# Patient Record
Sex: Female | Born: 1945 | Race: White | Hispanic: No
Health system: Southern US, Community
[De-identification: ages and names within clinical notes are randomized; demographics above are authoritative.]

## PROBLEM LIST (undated history)

## (undated) DIAGNOSIS — F0280 Dementia in other diseases classified elsewhere without behavioral disturbance: Secondary | ICD-10-CM

## (undated) DIAGNOSIS — G309 Alzheimer's disease, unspecified: Secondary | ICD-10-CM

---

## 2018-07-26 ENCOUNTER — Emergency Department
Admission: EM | Admit: 2018-07-26 | Discharge: 2018-07-26 | Disposition: A | Discharge status: Skilled Nursing Facility | Payer: Medicare Other | Attending: Emergency Medicine | Admitting: Emergency Medicine

## 2018-07-26 ENCOUNTER — Emergency Department: Payer: Medicare Other

## 2018-07-26 ENCOUNTER — Other Ambulatory Visit: Payer: Self-pay

## 2018-07-26 DIAGNOSIS — S0990XA Unspecified injury of head, initial encounter: Secondary | ICD-10-CM | POA: Diagnosis present

## 2018-07-26 DIAGNOSIS — S0083XA Contusion of other part of head, initial encounter: Secondary | ICD-10-CM | POA: Insufficient documentation

## 2018-07-26 DIAGNOSIS — Y92199 Unspecified place in other specified residential institution as the place of occurrence of the external cause: Secondary | ICD-10-CM | POA: Insufficient documentation

## 2018-07-26 DIAGNOSIS — Y939 Activity, unspecified: Secondary | ICD-10-CM | POA: Diagnosis not present

## 2018-07-26 DIAGNOSIS — Y999 Unspecified external cause status: Secondary | ICD-10-CM | POA: Diagnosis not present

## 2018-07-26 DIAGNOSIS — G309 Alzheimer's disease, unspecified: Secondary | ICD-10-CM | POA: Diagnosis not present

## 2018-07-26 DIAGNOSIS — W06XXXA Fall from bed, initial encounter: Secondary | ICD-10-CM | POA: Diagnosis not present

## 2018-07-26 DIAGNOSIS — W19XXXA Unspecified fall, initial encounter: Secondary | ICD-10-CM

## 2018-07-26 HISTORY — DX: Dementia in other diseases classified elsewhere, unspecified severity, without behavioral disturbance, psychotic disturbance, mood disturbance, and anxiety: F02.80

## 2018-07-26 HISTORY — DX: Alzheimer's disease, unspecified: G30.9

## 2018-07-26 NOTE — ED Notes (Signed)
Patient transported to CT 

## 2018-07-26 NOTE — ED Triage Notes (Signed)
Pt arrives from [facility] via [facility]. Pt fell out of bed per EMS and got up back into bed. EMS reports pt is from alzheimers unit. Pt is alert and oriented. Bruising noted to R forehead and R cheek. Dr. Mayford Knife in room upon arrival.

## 2018-07-26 NOTE — ED Provider Notes (Signed)
[facility] Emergency Department Provider Note       Time seen: ----------------------------------------- 8:39 PM on 07/26/2018 -----------------------------------------   I have reviewed the triage vital signs and the nursing notes.  HISTORY   Chief Complaint Fall Level V caveat: History/ROS limited by dementia   HPI Brooke Hood is a 72 y.o. female with a history of Alzheimer's disease who presents to the ED for a witnessed fall.  Patient arrives by EMS from [place] [facility] after she fell out of bed.  According to EMS she got back up into the bed.  She is from the Alzheimer's unit and is disoriented.  Bruising was noted and around the right eye cheek and forehead.  She denies any other injuries or complaints.  Past Medical History:  Diagnosis Date  . Alzheimer disease     There are no active problems to display for this patient.   History reviewed. No pertinent surgical history.  Allergies Patient has no known allergies.  Social History Social History   Tobacco Use  . Smoking status: Unknown If Ever Smoked  Substance Use Topics  . Alcohol use: Not Currently  . Drug use: Not on file   Review of Systems Constitutional: Negative for fever. Eyes: Negative for vision changes ENT:  Negative for congestion, sore throat Cardiovascular: Negative for chest pain. Respiratory: Negative for shortness of breath. Gastrointestinal: Negative for abdominal pain, vomiting and diarrhea. Genitourinary: Negative for dysuria. Musculoskeletal: Negative for back pain. Skin: Negative for rash. Neurological: Negative for headaches, focal weakness or numbness.  All systems negative/normal/unremarkable except as stated in the HPI  ____________________________________________   PHYSICAL EXAM:  VITAL SIGNS: ED Triage Vitals  Enc Vitals Group     BP 07/26/18 2024 (!) 142/64     Pulse Rate 07/26/18 2024 (!) 58     Resp 07/26/18 2024 16     Temp 07/26/18 2024  98.4 F (36.9 C)     Temp Source 07/26/18 2024 Oral     SpO2 07/26/18 2024 100 %     Weight 07/26/18 2025 135 lb (61.2 kg)     Height 07/26/18 2025 5\' 7"  (1.702 m)     Head Circumference --      Peak Flow --      Pain Score 07/26/18 2025 0     Pain Loc --      Pain Edu? --      Excl. in GC? --    Constitutional: Alert but disoriented.  Well appearing and in no distress. Eyes: Conjunctivae are normal. Normal extraocular movements. ENT   Head: Normocephalic, large right periorbital and right forehead contusion   Nose: No congestion/rhinnorhea.   Mouth/Throat: Mucous membranes are moist.   Neck: No stridor. Cardiovascular: Normal rate, regular rhythm. No murmurs, rubs, or gallops. Respiratory: Normal respiratory effort without tachypnea nor retractions. Breath sounds are clear and equal bilaterally. No wheezes/rales/rhonchi. Gastrointestinal: Soft and nontender. Normal bowel sounds Musculoskeletal: Nontender with normal range of motion in extremities. No lower extremity tenderness nor edema.  No pain elicited with range of motion of the hips, upper or lower extremities Neurologic:  Normal speech and language. No gross focal neurologic deficits are appreciated.  Skin: Right periorbital ecchymosis ____________________________________________  ED COURSE:  As part of my medical decision making, I reviewed the following data within the electronic MEDICAL RECORD NUMBER History obtained from family if available, nursing notes, old chart and ekg, as well as notes from prior ED visits. Patient presented for a fall, we will  assess with imaging as indicated at this time.   Procedures ____________________________________________   RADIOLOGY Images were viewed by me  CT head, maxillofacial IMPRESSION: 1. No evidence of acute intracranial abnormality. 2. Moderate cerebral atrophy and mild to moderate chronic small vessel ischemic disease. 3. Right periorbital and forehead  hematoma. 4. No acute maxillofacial fracture. ____________________________________________  DIFFERENTIAL DIAGNOSIS   Fall, contusion, fracture, subdural hematoma, concussion  FINAL ASSESSMENT AND PLAN  Fall, minor head injury, facial contusion   Plan: The patient had presented for a fall. Patient's imaging did not reveal any acute process, she is cleared for outpatient follow-up.   Ulice Dash, MD   Note: This note was generated in part or whole with voice recognition software. Voice recognition is usually quite accurate but there are transcription errors that can and very often do occur. I apologize for any typographical errors that were not detected and corrected.     Emily Filbert, MD 07/26/18 2155

## 2018-07-26 NOTE — ED Notes (Signed)
Pt unable to sign for DC d/t alzheimers. Pt taken back to [facility] by [facility].

## 2018-09-12 ENCOUNTER — Emergency Department: Payer: Medicare Other

## 2018-09-12 ENCOUNTER — Emergency Department
Admission: EM | Admit: 2018-09-12 | Discharge: 2018-09-12 | Disposition: A | Discharge status: Other Acute Inpatient Hospital | Payer: Medicare Other | Attending: Emergency Medicine | Admitting: Emergency Medicine

## 2018-09-12 ENCOUNTER — Encounter: Payer: Self-pay | Admitting: Emergency Medicine

## 2018-09-12 ENCOUNTER — Other Ambulatory Visit: Payer: Self-pay

## 2018-09-12 ENCOUNTER — Ambulatory Visit (HOSPITAL_COMMUNITY)
Admission: AD | Admit: 2018-09-12 | Discharge: 2018-09-12 | Disposition: A | Discharge status: Home / Self Care | Payer: Medicare Other | Source: Other Acute Inpatient Hospital | Attending: Emergency Medicine | Admitting: Emergency Medicine

## 2018-09-12 DIAGNOSIS — W19XXXA Unspecified fall, initial encounter: Secondary | ICD-10-CM | POA: Diagnosis not present

## 2018-09-12 DIAGNOSIS — Y998 Other external cause status: Secondary | ICD-10-CM | POA: Insufficient documentation

## 2018-09-12 DIAGNOSIS — Y92129 Unspecified place in nursing home as the place of occurrence of the external cause: Secondary | ICD-10-CM | POA: Insufficient documentation

## 2018-09-12 DIAGNOSIS — W0110XA Fall on same level from slipping, tripping and stumbling with subsequent striking against unspecified object, initial encounter: Secondary | ICD-10-CM | POA: Diagnosis not present

## 2018-09-12 DIAGNOSIS — F0280 Dementia in other diseases classified elsewhere without behavioral disturbance: Secondary | ICD-10-CM | POA: Insufficient documentation

## 2018-09-12 DIAGNOSIS — Y9389 Activity, other specified: Secondary | ICD-10-CM | POA: Insufficient documentation

## 2018-09-12 DIAGNOSIS — G309 Alzheimer's disease, unspecified: Secondary | ICD-10-CM | POA: Diagnosis not present

## 2018-09-12 DIAGNOSIS — S066X0A Traumatic subarachnoid hemorrhage without loss of consciousness, initial encounter: Secondary | ICD-10-CM | POA: Diagnosis present

## 2018-09-12 DIAGNOSIS — S0101XA Laceration without foreign body of scalp, initial encounter: Secondary | ICD-10-CM | POA: Diagnosis not present

## 2018-09-12 DIAGNOSIS — S0990XA Unspecified injury of head, initial encounter: Secondary | ICD-10-CM | POA: Diagnosis present

## 2018-09-12 DIAGNOSIS — I609 Nontraumatic subarachnoid hemorrhage, unspecified: Secondary | ICD-10-CM

## 2018-09-12 LAB — CBC WITH DIFFERENTIAL/PLATELET
ABS IMMATURE GRANULOCYTES: 0.02 10*3/uL (ref 0.00–0.07)
BASOS PCT: 0 %
Basophils Absolute: 0 10*3/uL (ref 0.0–0.1)
EOS ABS: 0 10*3/uL (ref 0.0–0.5)
Eosinophils Relative: 1 %
HCT: 37.9 % (ref 36.0–46.0)
Hemoglobin: 12.6 g/dL (ref 12.0–15.0)
IMMATURE GRANULOCYTES: 0 %
Lymphocytes Relative: 22 %
Lymphs Abs: 1.3 10*3/uL (ref 0.7–4.0)
MCH: 29.6 pg (ref 26.0–34.0)
MCHC: 33.2 g/dL (ref 30.0–36.0)
MCV: 89.2 fL (ref 80.0–100.0)
MONO ABS: 0.4 10*3/uL (ref 0.1–1.0)
Monocytes Relative: 8 %
NEUTROS ABS: 4 10*3/uL (ref 1.7–7.7)
NRBC: 0 % (ref 0.0–0.2)
Neutrophils Relative %: 69 %
PLATELETS: 186 10*3/uL (ref 150–400)
RBC: 4.25 MIL/uL (ref 3.87–5.11)
RDW: 13.6 % (ref 11.5–15.5)
WBC: 5.8 10*3/uL (ref 4.0–10.5)

## 2018-09-12 LAB — BASIC METABOLIC PANEL
ANION GAP: 9 (ref 5–15)
BUN: 10 mg/dL (ref 8–23)
CHLORIDE: 101 mmol/L (ref 98–111)
CO2: 29 mmol/L (ref 22–32)
Calcium: 8.4 mg/dL — ABNORMAL LOW (ref 8.9–10.3)
Creatinine, Ser: 0.61 mg/dL (ref 0.44–1.00)
GFR calc Af Amer: >60 mL/min (ref >60)
Glucose, Bld: 98 mg/dL (ref 70–99)
POTASSIUM: 3.8 mmol/L (ref 3.5–5.1)
SODIUM: 139 mmol/L (ref 135–145)

## 2018-09-12 LAB — PROTIME-INR
INR: 1.05
PROTHROMBIN TIME: 13.6 s (ref 11.4–15.2)

## 2018-09-12 MED ORDER — LIDOCAINE-EPINEPHRINE 2 %-1:100000 IJ SOLN
20.0000 mL | Freq: Once | INTRAMUSCULAR | Status: AC
  Administered 2018-09-12: 1 mL via INTRADERMAL

## 2018-09-12 MED ORDER — LIDOCAINE-EPINEPHRINE 2 %-1:100000 IJ SOLN
INTRAMUSCULAR | Status: AC
  Administered 2018-09-12: 1 mL via INTRADERMAL
  Filled 2018-09-12: qty 1

## 2018-09-12 MED ORDER — LIDOCAINE-EPINEPHRINE (PF) 2 %-1:200000 IJ SOLN: 10.0000 mL | Freq: Once | INTRAMUSCULAR | Status: DC

## 2018-09-12 MED ORDER — TETANUS-DIPHTH-ACELL PERTUSSIS 5-2.5-18.5 LF-MCG/0.5 IM SUSP: 0.5000 mL | Freq: Once | INTRAMUSCULAR | Status: DC

## 2018-09-12 NOTE — ED Notes (Signed)
Pt relative notified  [place], [place] Relative   [phone]

## 2018-09-12 NOTE — ED Notes (Signed)
Report to [facility], [facility] att, approx out

## 2018-09-12 NOTE — ED Notes (Signed)
[facility] to transport to [facility] per Melody

## 2018-09-12 NOTE — ED Notes (Signed)
Patient transported to CT 

## 2018-09-12 NOTE — ED Triage Notes (Signed)
Pt presents to ED via [facility] from [facility] c/o witnessed fall. EMS report pt tripped while using her walker. Lac sustained to L forehead and skin tear to L hand. EMS report SNF staff state pt is at baseline mental status at this time - nonverbal, hx dementia. Pt does not answer questions or follow commands. Is periodically combative with care. No blood thinners noted on MAR.   EMS report swelling to L knee. Pt does not have any s/sx with motion of bil lower extremities. Multiple old bruises noted to both knees and shins.

## 2018-09-12 NOTE — ED Provider Notes (Addendum)
[facility] Emergency Department Provider Note  ____________________________________________   I have reviewed the triage vital signs and the nursing notes. Where available I have reviewed prior notes and, if possible and indicated, outside hospital notes.    HISTORY  Chief Complaint Fall and Head Injury    HPI Brooke Hood is a 72 y.o. female  Who suffers from significant dementia, is at her baseline according to EMS, tripped over her walker.  Patient herself cannot give a steroid.  Did sustain a head injury.  No loss of consciousness known.  Level 5 chart caveat; no further history available due to patient status.  I did try to call her emergency contact with a phone rang and no unanswered there was no answering machine that picked up.  Patient's med list does not show any anticoagulation patient is DNR/DNI    Past Medical History:  Diagnosis Date  . Alzheimer disease (HCC)     There are no active problems to display for this patient.   History reviewed. No pertinent surgical history.  Prior to Admission medications   Not on File    Allergies Patient has no known allergies.  History reviewed. No pertinent family history.  Social History Social History   Tobacco Use  . Smoking status: Unknown If Ever Smoked  . Smokeless tobacco: Never Used  Substance Use Topics  . Alcohol use: Not Currently  . Drug use: Not on file    Review of Systems Level 5 chart caveat; no further history available due to patient status.  ____________________________________________   PHYSICAL EXAM:  VITAL SIGNS: ED Triage Vitals  Enc Vitals Group     BP 09/12/18 1914 (!) 119/48     Pulse Rate 09/12/18 1914 70     Resp 09/12/18 1914 18     Temp 09/12/18 1914 98.2 F (36.8 C)     Temp Source 09/12/18 1914 Axillary     SpO2 09/12/18 1914 100 %     Weight 09/12/18 1915 140 lb (63.5 kg)     Height 09/12/18 1915 5\' 6"  (1.676 m)     Head Circumference --       Peak Flow --      Pain Score --      Pain Loc --      Pain Edu? --      Excl. in GC? --     Constitutional: Alert will tell me her name, no acute distress, significant dementia noted. Eyes: Conjunctivae are normal Head: Stellate laceration to the left forehead.  Bleeding controlled.  No evidence of acute bony injury HEENT: No congestion/rhinnorhea. Mucous membranes are moist.  Oropharynx non-erythematous Neck:   Nontender with no meningismus, no masses, no stridor Cardiovascular: Normal rate, regular rhythm. Grossly normal heart sounds.  Good peripheral circulation. Respiratory: Normal respiratory effort.  No retractions. Lungs CTAB. Abdominal: Soft and nontender. No distention. No guarding no rebound Back:  There is no focal tenderness or step off.  there is no midline tenderness there are no lesions noted. there is no CVA tenderness Musculoskeletal: No lower extremity tenderness, no upper extremity tenderness. No joint effusions, no DVT signs strong distal pulses no edema, I can range her joints with no evidence of discomfort. Neurologic: We will talk to me a little bit sometimes tells me her name Skin:  Skin is warm, stellate laceration to l forehead lineal laceration to l forehead and skin tear to l hand Psychiatric: Mood and affect are normal. Speech and behavior are normal.  ____________________________________________   LABS (all labs ordered are listed, but only abnormal results are displayed)  Labs Reviewed  BASIC METABOLIC PANEL  CBC WITH DIFFERENTIAL/PLATELET  PROTIME-INR    Pertinent labs  results that were available during my care of the patient were reviewed by me and considered in my medical decision making (see chart for details). ____________________________________________  EKG  I personally interpreted any EKGs ordered by me or triage  ____________________________________________  RADIOLOGY  Pertinent labs & imaging results that were available during my  care of the patient were reviewed by me and considered in my medical decision making (see chart for details). If possible, patient and/or family made aware of any abnormal findings.  Ct Head Wo Contrast  Result Date: 09/12/2018 CLINICAL DATA:  Left forehead laceration after trip and fall. EXAM: CT HEAD WITHOUT CONTRAST CT CERVICAL SPINE WITHOUT CONTRAST TECHNIQUE: Multidetector CT imaging of the head and cervical spine was performed following the standard protocol without intravenous contrast. Multiplanar CT image reconstructions of the cervical spine were also generated. COMPARISON:  07/26/2018 and CT FINDINGS: CT HEAD FINDINGS Brain: Acute right parietal and left frontal subarachnoid foci of hemorrhage. Chronic cerebral atrophy with mild-to-moderate small vessel ischemia. Superficial and central atrophy with chronic moderate small vessel ischemic disease. Midline fourth ventricle and basal cisterns without effacement. Cerebellar atrophy is noted. No large vascular territory infarct. No intra-axial mass. Vascular: Atherosclerosis of the carotid siphons. No hyperdense vessel sign. Skull: No acute skull fracture. Calvarial soft tissue swelling anteriorly. Sinuses/Orbits: Moderate mucosal thickening of the included maxillary sinuses. Evidence of prior functional endoscopic sinus surgery bilaterally. Moderate ethmoid and left sphenoid sinus mucosal thickening. Other: Clear mastoids. CT CERVICAL SPINE FINDINGS Alignment: Maintained cervical lordosis. Skull base and vertebrae: No acute fracture. Incomplete posterior arch of C1 likely developmental. No primary bone lesion or focal pathologic process. Soft tissues and spinal canal: No prevertebral fluid or swelling. No visible canal hematoma. Disc levels: Mild-to-moderate disc flattening C4 through C7. Small posterior marginal osteophytes at C4-5. 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 Bilateral facet arthrosis. Mild left C5-6  foraminal encroachment from uncinate spurring. Upper chest: Negative Other: None IMPRESSION: 1. Acute left frontal and right parietal subarachnoid foci of hemorrhage without shift or edema. 2. Chronic microvascular ischemic disease and cerebral atrophy. 3. No acute cervical spine fracture or static listhesis. These results were called by telephone at the time of interpretation on 09/12/2018 at 7:58 pm to Dr. Ileana Roup , who verbally acknowledged these results. Electronically Signed   By: Tollie Eth M.D.   On: 09/12/2018 19:59   Ct Cervical Spine Wo Contrast  Result Date: 09/12/2018 CLINICAL DATA:  Left forehead laceration after trip and fall. EXAM: CT HEAD WITHOUT CONTRAST CT CERVICAL SPINE WITHOUT CONTRAST TECHNIQUE: Multidetector CT imaging of the head and cervical spine was performed following the standard protocol without intravenous contrast. Multiplanar CT image reconstructions of the cervical spine were also generated. COMPARISON:  07/26/2018 and CT FINDINGS: CT HEAD FINDINGS Brain: Acute right parietal and left frontal subarachnoid foci of hemorrhage. Chronic cerebral atrophy with mild-to-moderate small vessel ischemia. Superficial and central atrophy with chronic moderate small vessel ischemic disease. Midline fourth ventricle and basal cisterns without effacement. Cerebellar atrophy is noted. No large vascular territory infarct. No intra-axial mass. Vascular: Atherosclerosis of the carotid siphons. No hyperdense vessel sign. Skull: No acute skull fracture. Calvarial soft tissue swelling anteriorly. Sinuses/Orbits: Moderate mucosal thickening of the included maxillary sinuses. Evidence of prior functional endoscopic sinus surgery bilaterally.  Moderate ethmoid and left sphenoid sinus mucosal thickening. Other: Clear mastoids. CT CERVICAL SPINE FINDINGS Alignment: Maintained cervical lordosis. Skull base and vertebrae: No acute fracture. Incomplete posterior arch of C1 likely developmental. No  primary bone lesion or focal pathologic process. Soft tissues and spinal canal: No prevertebral fluid or swelling. No visible canal hematoma. Disc levels: Mild-to-moderate disc flattening C4 through C7. Small posterior marginal osteophytes at C4-5. 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 Bilateral facet arthrosis. Mild left C5-6 foraminal encroachment from uncinate spurring. Upper chest: Negative Other: None IMPRESSION: 1. Acute left frontal and right parietal subarachnoid foci of hemorrhage without shift or edema. 2. Chronic microvascular ischemic disease and cerebral atrophy. 3. No acute cervical spine fracture or static listhesis. These results were called by telephone at the time of interpretation on 09/12/2018 at 7:58 pm to Dr. Ileana Roup , who verbally acknowledged these results. Electronically Signed   By: Tollie Eth M.D.   On: 09/12/2018 19:59   ____________________________________________    PROCEDURES  Procedure(s) performed: None  3 cm stellate deep laceration to forehead  .Marland KitchenLaceration Repair Date/Time: 09/12/2018 9:03 PM Performed by: Jeanmarie Plant, MD Authorized by: Jeanmarie Plant, MD   Consent:    Consent obtained:  Emergent situation   Consent given by:  Patient   Risks discussed:  Infection, pain, retained foreign body, need for additional repair and poor cosmetic result   Alternatives discussed:  No treatment and delayed treatment Anesthesia (see MAR for exact dosages):    Anesthesia method:  Local infiltration   Local anesthetic:  Lidocaine 1% WITH epi Laceration details:    Location:  Scalp   Scalp location:  Frontal Repair type:    Repair type:  Intermediate Pre-procedure details:    Preparation:  Patient was prepped and draped in usual sterile fashion and imaging obtained to evaluate for foreign bodies Exploration:    Hemostasis achieved with:  Direct pressure   Wound exploration: entire depth of wound probed and  visualized     Contaminated: no   Treatment:    Area cleansed with:  Betadine and saline   Amount of cleaning:  Extensive   Visualized foreign bodies/material removed: no   Skin repair:    Repair method:  Sutures   Suture size:  5-0   Suture material:  Prolene   Number of sutures:  6 Approximation:    Approximation:  Close Post-procedure details:    Dressing:  Open (no dressing)   Patient tolerance of procedure:  Tolerated well, no immediate complications Comments:     Stellate laceration required complex closuire  .Marland KitchenLaceration Repair Date/Time: 09/12/2018 9:05 PM Performed by: Jeanmarie Plant, MD Authorized by: Jeanmarie Plant, MD   Consent:    Consent obtained:  Verbal and emergent situation   Consent given by:  Patient   Risks discussed:  Infection, retained foreign body, pain, need for additional repair and poor cosmetic result   Alternatives discussed:  No treatment Anesthesia (see MAR for exact dosages):    Anesthesia method:  None Laceration details:    Location:  Scalp   Scalp location:  Frontal   Length (cm):  2 Repair type:    Repair type:  Simple Pre-procedure details:    Preparation:  Patient was prepped and draped in usual sterile fashion Exploration:    Wound exploration: entire depth of wound probed and visualized     Contaminated: no   Treatment:    Area cleansed with:  Betadine  Amount of cleaning:  Standard Skin repair:    Repair method:  Tissue adhesive Approximation:    Approximation:  Close Post-procedure details:    Dressing:  Open (no dressing)   Patient tolerance of procedure:  Tolerated well, no immediate complications    Critical Care performed: CRITICAL CARE Performed by: Jeanmarie Plant   Total critical care time: 45 minutes  Critical care time was exclusive of separately billable procedures and treating other patients.  Critical care was necessary to treat or prevent imminent or life-threatening deterioration.  Critical  care was time spent personally by me on the following activities: development of treatment plan with patient and/or surrogate as well as nursing, discussions with consultants, evaluation of patient's response to treatment, examination of patient, obtaining history from patient or surrogate, ordering and performing treatments and interventions, ordering and review of laboratory studies, ordering and review of radiographic studies, pulse oximetry and re-evaluation of patient's condition.   ____________________________________________   INITIAL IMPRESSION / ASSESSMENT AND PLAN / ED COURSE  Pertinent labs & imaging results that were available during my care of the patient were reviewed by me and considered in my medical decision making (see chart for details).  Patient presents after was reported to be non-syncopal fall with a closed head injury.  CT scan unfortunately shows an acute head bleed.  Does appear to be neurologically intact to the extent that I can get a good neurologic exam on her which is very limited actually.  Unfortunately, we are not a trauma center we do not have any neurosurgery on call.  I did call family to see if there is anything that they would want to have done and they are not picking up.  Given this limitation I am calling [facility] to see if and get her transferred to an acute trauma center.  ----------------------------------------- 8:35 PM on 09/12/2018 -----------------------------------------  I have talked to [facility], Dr. Jenna Luo accepts the patient in ED to ED transfer I very much appreciate the consult.  Blood work is pending.  ----------------------------------------- 9:07 PM on 09/12/2018 -----------------------------------------  Signed out at the end of my shift to dr. Mayford Knife    ____________________________________________   FINAL CLINICAL IMPRESSION(S) / ED DIAGNOSES  Final diagnoses:  None      This chart was dictated using voice recognition  software.  Despite best efforts to proofread,  errors can occur which can change meaning.     Jeanmarie Plant, MD 09/12/18 2035  Jeanmarie Plant, MD 09/12/18 2041  Jeanmarie Plant, MD 09/12/18 2105  Jeanmarie Plant, MD 09/12/18 2106  Jeanmarie Plant, MD 09/12/18 2107  Jeanmarie Plant, MD 09/12/18 2108

## 2018-09-12 NOTE — ED Notes (Signed)
Pt leaving with [facility] att, transport declined [facility]AP and reports passing off info to [facility]

## 2018-09-12 NOTE — ED Notes (Addendum)
Wound opened after CT used strap to get a clear image, pt in room now and bleeding abated, pt wound is cleaned, lac cart outside room, EDP notified

## 2019-06-28 ENCOUNTER — Other Ambulatory Visit: Payer: Self-pay

## 2019-06-28 ENCOUNTER — Emergency Department: Payer: Medicare Other

## 2019-06-28 ENCOUNTER — Emergency Department
Admission: EM | Admit: 2019-06-28 | Discharge: 2019-06-28 | Disposition: A | Discharge status: Home / Self Care | Payer: Medicare Other | Attending: Emergency Medicine | Admitting: Emergency Medicine

## 2019-06-28 DIAGNOSIS — G309 Alzheimer's disease, unspecified: Secondary | ICD-10-CM | POA: Insufficient documentation

## 2019-06-28 DIAGNOSIS — R569 Unspecified convulsions: Secondary | ICD-10-CM | POA: Insufficient documentation

## 2019-06-28 LAB — CBC WITH DIFFERENTIAL/PLATELET
Abs Immature Granulocytes: 0.04 10*3/uL (ref 0.00–0.07)
Basophils Absolute: 0.1 10*3/uL (ref 0.0–0.1)
Basophils Relative: 0 %
Eosinophils Absolute: 0.1 10*3/uL (ref 0.0–0.5)
Eosinophils Relative: 0 %
HCT: 41.5 % (ref 36.0–46.0)
Hemoglobin: 13.6 g/dL (ref 12.0–15.0)
Immature Granulocytes: 0 %
Lymphocytes Relative: 53 %
Lymphs Abs: 7.6 10*3/uL — ABNORMAL HIGH (ref 0.7–4.0)
MCH: 31 pg (ref 26.0–34.0)
MCHC: 32.8 g/dL (ref 30.0–36.0)
MCV: 94.5 fL (ref 80.0–100.0)
Monocytes Absolute: 0.7 10*3/uL (ref 0.1–1.0)
Monocytes Relative: 5 %
Neutro Abs: 6.1 10*3/uL (ref 1.7–7.7)
Neutrophils Relative %: 42 %
Platelets: 202 10*3/uL (ref 150–400)
RBC: 4.39 MIL/uL (ref 3.87–5.11)
RDW: 12.7 % (ref 11.5–15.5)
WBC: 14.5 10*3/uL — ABNORMAL HIGH (ref 4.0–10.5)
nRBC: 0 % (ref 0.0–0.2)

## 2019-06-28 LAB — URINALYSIS, COMPLETE (UACMP) WITH MICROSCOPIC
Bacteria, UA: NONE SEEN
Bilirubin Urine: NEGATIVE
Glucose, UA: NEGATIVE mg/dL
Hgb urine dipstick: NEGATIVE
Ketones, ur: NEGATIVE mg/dL
Leukocytes,Ua: NEGATIVE
Nitrite: NEGATIVE
Protein, ur: NEGATIVE mg/dL
Specific Gravity, Urine: 1.016 (ref 1.005–1.030)
Squamous Epithelial / LPF: NONE SEEN (ref 0–5)
pH: 5 (ref 5.0–8.0)

## 2019-06-28 LAB — BASIC METABOLIC PANEL
Anion gap: 18 — ABNORMAL HIGH (ref 5–15)
BUN: 15 mg/dL (ref 8–23)
CO2: 17 mmol/L — ABNORMAL LOW (ref 22–32)
Calcium: 9.2 mg/dL (ref 8.9–10.3)
Chloride: 105 mmol/L (ref 98–111)
Creatinine, Ser: 0.77 mg/dL (ref 0.44–1.00)
GFR calc Af Amer: >60 mL/min (ref >60)
GFR calc non Af Amer: >60 mL/min (ref >60)
Glucose, Bld: 147 mg/dL — ABNORMAL HIGH (ref 70–99)
Potassium: 4.9 mmol/L (ref 3.5–5.1)
Sodium: 140 mmol/L (ref 135–145)

## 2019-06-28 LAB — VALPROIC ACID LEVEL: Valproic Acid Lvl: 30 ug/mL — ABNORMAL LOW (ref 50.0–100.0)

## 2019-06-28 MED ORDER — LORAZEPAM 2 MG/ML IJ SOLN
2.0000 mg | Freq: Once | INTRAMUSCULAR | Status: AC
  Administered 2019-06-28: 18:00:00 2 mg via INTRAVENOUS

## 2019-06-28 MED ORDER — VALPROATE SODIUM 500 MG/5ML IV SOLN
250.0000 mg | Freq: Once | INTRAVENOUS | Status: AC
  Administered 2019-06-28: 250 mg via INTRAVENOUS
  Filled 2019-06-28: qty 2.5

## 2019-06-28 MED ORDER — SODIUM CHLORIDE 0.9 % IV BOLUS
500.0000 mL | Freq: Once | INTRAVENOUS | Status: AC
  Administered 2019-06-28: 20:00:00 500 mL via INTRAVENOUS

## 2019-06-28 NOTE — ED Triage Notes (Signed)
Pt with witnessed 11.5 minute seizure at [facility]. Pt with hx of seizures. Pt with tremors and clenching on arrival.

## 2019-06-28 NOTE — ED Provider Notes (Signed)
[facility] Emergency Department Provider Note  ____________________________________________   I have reviewed the triage vital signs and the nursing notes.   HISTORY  Chief Complaint Seizures   History limited by and level 5 caveat due to: Altered Mental Status   HPI Brooke Hood is a 73 y.o. female who presents to the emergency department today from living facility via EMS because of concern for seizure. The patient is unable to give any history. Patient does have history of seizures and is on medication. Per report the patient was sitting down when the seizure started. Lasted roughly 11 minutes. When EMS arrived the patient was no longer seizing. Did start having another seizure during transport.    Records reviewed. Per medical record review patient has a history of alzheimer disease  Past Medical History:  Diagnosis Date  . Alzheimer disease ([place])     There are no active problems to display for this patient.   No past surgical history on file.  Prior to Admission medications   Not on File    Allergies Patient has no known allergies.  No family history on file.  Social History Social History   Tobacco Use  . Smoking status: Unknown If Ever Smoked  . Smokeless tobacco: Never Used  Substance Use Topics  . Alcohol use: Not Currently  . Drug use: Not on file    Review of Systems Unable to obtain secondary to altered mental status.  ____________________________________________   PHYSICAL EXAM:  VITAL SIGNS: ED Triage Vitals  Enc Vitals Group     BP 118/67     Pulse 93     Resp 22     Temp 99.1     Temp src      SpO2 98   Constitutional: Actively seizing.  Eyes: Conjunctivae are normal.  ENT      Head: Normocephalic and atraumatic.      Nose: No congestion/rhinnorhea.      Mouth/Throat: Mucous membranes are moist.      Neck: No stridor. Hematological/Lymphatic/Immunilogical: No cervical lymphadenopathy. Cardiovascular:  Normal rate, regular rhythm.  No murmurs, rubs, or gallops.  Respiratory: Normal respiratory effort without tachypnea nor retractions. Breath sounds are clear and equal bilaterally. No wheezes/rales/rhonchi. Gastrointestinal: Soft and non tender. No rebound. No guarding.  Genitourinary: Deferred Musculoskeletal: Normal range of motion in all extremities. No lower extremity edema. Neurologic:  Actively seizing. Full tonic clonic.  Skin:  Skin is warm, dry and intact. No rash noted. ____________________________________________    LABS (pertinent positives/negatives)  CBC wbc 14.5, hgb 13.6, plt 202 Valproic acid level 30 UA clear unremarkable BMP wnl except co2 17, glu 147, anion gap 18 ____________________________________________   EKG  I, Nance Pear, attending physician, personally viewed and interpreted this EKG  EKG Time: 1818 Rate: 94 Rhythm: sinus rhythm Axis: normal Intervals: qtc 463 QRS: low voltage precordial leads ST changes: no st elevation Impression: abnormal ekg  ____________________________________________    RADIOLOGY  CT head No acute findings. Bilateral subdural vs hygroma  CXR No acute abnormality  ____________________________________________   PROCEDURES  Procedures  ____________________________________________   INITIAL IMPRESSION / ASSESSMENT AND PLAN / ED COURSE  Pertinent labs & imaging results that were available during my care of the patient were reviewed by me and considered in my medical decision making (see chart for details).   Patient presented to the emergency department today because of concerns for seizure.  Patient was actively seizing upon arrival to the emergency department however did break  on its own.  Patient was then given Ativan.  Work-up was initiated to try to evaluate for potential causes of seizure.  No obvious infection was found.  Patient's valproic acid was low thus concerning for subtherapeutic levels.   Patient was given IV dose of valproic acid here.  Patient did appear to return to a better mental baseline.  She did states she felt like she wanted to go home.  Did try to contact family in chart.  This point given the patient appears to be at baseline and no concerning work-up safe for subtherapeutic level do think is reasonable for patient to go back to living facility.   ____________________________________________   FINAL CLINICAL IMPRESSION(S) / ED DIAGNOSES  Final diagnoses:  Seizure [facility](HCC)     Note: This dictation was prepared with Dragon dictation. Any transcriptional errors that result from this process are unintentional     Phineas SemenGoodman, Gradie Ohm, MD 06/28/19 2239

## 2019-06-28 NOTE — ED Notes (Signed)
Pt unable to sign  

## 2019-06-28 NOTE — ED Notes (Signed)
Patient transported to CT 

## 2019-06-28 NOTE — ED Notes (Signed)
Pt alert, given oj and crackers at this time.

## 2019-06-28 NOTE — ED Notes (Signed)
Awake, not conversant but when questioned if she is OK states OK.

## 2019-06-28 NOTE — Discharge Instructions (Addendum)
Please seek medical attention for any high fevers, chest pain, shortness of breath, change in behavior, persistent vomiting, bloody stool or any other new or concerning symptoms.  

## 2019-06-29 LAB — PATHOLOGIST SMEAR REVIEW

## 2019-07-09 ENCOUNTER — Non-Acute Institutional Stay: Payer: Medicare Other | Admitting: Nurse Practitioner

## 2019-07-09 ENCOUNTER — Encounter: Payer: Self-pay | Admitting: Nurse Practitioner

## 2019-07-09 ENCOUNTER — Other Ambulatory Visit: Payer: Self-pay

## 2019-07-09 VITALS — BP 142/67 | HR 88 | Temp 97.0°F | Resp 18 | Wt 105.0 lb

## 2019-07-09 DIAGNOSIS — Z515 Encounter for palliative care: Secondary | ICD-10-CM

## 2019-07-09 NOTE — Progress Notes (Signed)
[facility] Palliative Care Consult Note Telephone: [phone]  Fax: [phone]  PATIENT NAME: Brooke Hood DOB: 1945/11/29 MRN: [id number]  PRIMARY CARE PROVIDER:   Orvis [facility], [facility]  REFERRING PROVIDER:  [facility] RESPONSIBLE PARTY:   Brooke Hood; Son [phone]   I was asked by Brooke Fleming NP to see Brooke Hood for Palliative care consult for goc.  RECOMMENDATIONS and PLAN:  1. ACP: DNR; Copy placed in Vynca; sent blank copy of MOST form and hard choice book to Brooke Hood, Brooke Hood son for review and further discussion with family. Will revisit at next [facility] visit  2. Anorexia secondary to protein calorie malnutrition improving reflective of weight gain. Continue to monitor daily weights, supplements, supportive measures and courage to eat  3. Palliative care encounter Palliative medicine team will continue to support patient, patient's family, and medical team. Visit consisted of counseling and education dealing with the complex and emotionally intense issues of symptom management and palliative care in the setting of serious and potentially life-threatening illness  I spent 60 minutes providing this consultation,  from 10:30am to 11:30am. More than 50% of the time in this consultation was spent coordinating communication.   HISTORY OF PRESENT ILLNESS:  Brooke Hood is a 73 y.o. year old female with multiple medical problems including Vascular dementia, anxiety. Brooke Hood resides at lunch memory care unit at [facility].  She does require assistance with ambulating as she does use a walker and unsteady at time on her feet. No recent Falls. She does require assistance with ADLs, toileting as she is incontinent. She does feed herself after tray setup and appetite has been there.  She has been seen in the emergency department 10/26 / 2019 for fall with head injury tripped over her walker. She did sustain a forehead  laceration which was sutured. She was transferred to Brooke Hood for CT scan revealing and acute head bleed. Seen in the emergency department 8/10 / 2020 for seizures for which they did a Depakote level and found it to be subject therapeutic, receive Depakote in the emergency department in discharge back to the facility. At present time Brooke Hood is lying in bed. She denies any concerns or complaints. No visitors present. I visited them serve Brooke Hood. We talked about how she was feeling today. She endorses she's doing just fine. We talked about symptoms of pain and shortness of breath what she denies. We had a better appetite. She shared that she ate eggs for breakfast but doesn't remember what else. She was carpeted with assessment. Limited verbal discussion due to cognitive impairment. I have attempted to contact her son, message left. DNR does remain in place. I updated nursing staff in the new changes at present time will follow up after phone discussion with her son. I called Brooke Challenger, Brooke Hood son. Talk about purpose of palliative care visit in an agreement. We talked about visit with Brooke Hood. We talked about past medical history and sending chronic disease and dementia. We probably better cognitive functional abilities. You talk about what transition previously that led her to a locked memory care unit. Brooke Hood endorses that she has previously been under [employer] on two different terms but discharge due to stability. We talked about symptoms of pain but she does not seem to exhibit. We talked about appetite which remains declined. We talked about her cognitive ability to communicate. Encourage staff and Brooke Hood with Brooke Hood rather than telephone would  likely be more beneficial. We talked about medical goals of care. DNR in place. We talked about MOST form and Brooke Hood in agreement to send blank MOST for him and her choice book for review and further discussion with his brother. This way it is divorced. She does  have two sons. We talked about role of palliative care and plan of care. Discuss that will follow up in 3 to 4 sweets if needed or sooner should she declined. Therapeutic listening and emotional support provided. Contact information provided. Questions answer to satisfaction. Palliative Care was asked to help address goals of care.   CODE STATUS: DNR  PPS: 40% HOSPICE ELIGIBILITY/DIAGNOSIS: TBD  PAST MEDICAL HISTORY:  Past Medical History:  Diagnosis Date   Alzheimer disease (HCC)     SOCIAL HX:  Social History   Tobacco Use   Smoking status: Unknown If Ever Smoked   Smokeless tobacco: Never Used  Substance Use Topics   Alcohol use: Not Currently    ALLERGIES:  Allergies  Allergen Reactions   Penicillins Hives     PERTINENT MEDICATIONS:  Outpatient Encounter Medications as of 07/09/2019  Medication Sig   docusate sodium (COLACE) 100 MG capsule Take 50 mg by mouth at bedtime.   Lidocaine (ASPERCREME MAX STRENGTH) 4 % AERO Apply 1 patch topically 2 (two) times daily.   venlafaxine XR (EFFEXOR-XR) 75 MG 24 hr capsule Take 75 mg by mouth daily with breakfast.   No facility-administered encounter medications on file as of 07/09/2019.     PHYSICAL EXAM:   General: NAD, frail appearing, thin, female Cardiovascular: regular rate and rhythm Pulmonary: clear ant fields Abdomen: soft, nontender, + bowel sounds Extremities: no edema, no joint deformities Neurological: Weakness but otherwise nonfocal/unsteady gait; walks with walker  Tyliek Timberman Prince RomeZ Danille Oppedisano, NP

## 2019-07-31 ENCOUNTER — Emergency Department: Payer: Medicare Other

## 2019-07-31 ENCOUNTER — Emergency Department
Admission: EM | Admit: 2019-07-31 | Discharge: 2019-07-31 | Disposition: A | Discharge status: Home / Self Care | Payer: Medicare Other | Attending: Student | Admitting: Student

## 2019-07-31 ENCOUNTER — Other Ambulatory Visit: Payer: Self-pay

## 2019-07-31 DIAGNOSIS — G309 Alzheimer's disease, unspecified: Secondary | ICD-10-CM | POA: Diagnosis not present

## 2019-07-31 DIAGNOSIS — R569 Unspecified convulsions: Secondary | ICD-10-CM | POA: Insufficient documentation

## 2019-07-31 DIAGNOSIS — F0390 Unspecified dementia without behavioral disturbance: Secondary | ICD-10-CM | POA: Insufficient documentation

## 2019-07-31 DIAGNOSIS — N390 Urinary tract infection, site not specified: Secondary | ICD-10-CM

## 2019-07-31 DIAGNOSIS — Z79899 Other long term (current) drug therapy: Secondary | ICD-10-CM | POA: Insufficient documentation

## 2019-07-31 LAB — VALPROIC ACID LEVEL: Valproic Acid Lvl: 23 ug/mL — ABNORMAL LOW (ref 50.0–100.0)

## 2019-07-31 LAB — URINALYSIS, COMPLETE (UACMP) WITH MICROSCOPIC
Bilirubin Urine: NEGATIVE
Glucose, UA: NEGATIVE mg/dL
Hgb urine dipstick: NEGATIVE
Ketones, ur: NEGATIVE mg/dL
Nitrite: POSITIVE — AB
Protein, ur: 100 mg/dL — AB
RBC / HPF: >50 RBC/hpf — ABNORMAL HIGH (ref 0–5)
Specific Gravity, Urine: 1.01 (ref 1.005–1.030)
Squamous Epithelial / HPF: NONE SEEN (ref 0–5)
WBC, UA: >50 WBC/hpf — ABNORMAL HIGH (ref 0–5)
pH: 5 (ref 5.0–8.0)

## 2019-07-31 LAB — CBC WITH DIFFERENTIAL/PLATELET
Abs Immature Granulocytes: 0.01 10*3/uL (ref 0.00–0.07)
Basophils Absolute: 0 10*3/uL (ref 0.0–0.1)
Basophils Relative: 0 %
Eosinophils Absolute: 0 10*3/uL (ref 0.0–0.5)
Eosinophils Relative: 0 %
HCT: 38 % (ref 36.0–46.0)
Hemoglobin: 12.4 g/dL (ref 12.0–15.0)
Immature Granulocytes: 0 %
Lymphocytes Relative: 30 %
Lymphs Abs: 1.6 10*3/uL (ref 0.7–4.0)
MCH: 30.6 pg (ref 26.0–34.0)
MCHC: 32.6 g/dL (ref 30.0–36.0)
MCV: 93.8 fL (ref 80.0–100.0)
Monocytes Absolute: 0.3 10*3/uL (ref 0.1–1.0)
Monocytes Relative: 6 %
Neutro Abs: 3.3 10*3/uL (ref 1.7–7.7)
Neutrophils Relative %: 64 %
Platelets: 173 10*3/uL (ref 150–400)
RBC: 4.05 MIL/uL (ref 3.87–5.11)
RDW: 13 % (ref 11.5–15.5)
WBC: 5.3 10*3/uL (ref 4.0–10.5)
nRBC: 0 % (ref 0.0–0.2)

## 2019-07-31 LAB — BASIC METABOLIC PANEL
Anion gap: 15 (ref 5–15)
BUN: 11 mg/dL (ref 8–23)
CO2: 24 mmol/L (ref 22–32)
Calcium: 8.7 mg/dL — ABNORMAL LOW (ref 8.9–10.3)
Chloride: 103 mmol/L (ref 98–111)
Creatinine, Ser: 0.85 mg/dL (ref 0.44–1.00)
GFR calc Af Amer: >60 mL/min (ref >60)
GFR calc non Af Amer: >60 mL/min (ref >60)
Glucose, Bld: 81 mg/dL (ref 70–99)
Potassium: 4.1 mmol/L (ref 3.5–5.1)
Sodium: 142 mmol/L (ref 135–145)

## 2019-07-31 MED ORDER — CEFDINIR 300 MG PO CAPS: 300.0000 mg | ORAL_CAPSULE | Freq: Two times a day (BID) | ORAL | 0 refills | Status: AC

## 2019-07-31 MED ORDER — SODIUM CHLORIDE 0.9 % IV SOLN
1.0000 g | Freq: Once | INTRAVENOUS | Status: AC
  Administered 2019-07-31: 1 g via INTRAVENOUS
  Filled 2019-07-31: qty 10

## 2019-07-31 MED ORDER — LORAZEPAM 2 MG/ML IJ SOLN
2.0000 mg | Freq: Once | INTRAMUSCULAR | Status: AC
  Administered 2019-07-31: 16:00:00 2 mg via INTRAVENOUS

## 2019-07-31 MED ORDER — VALPROATE SODIUM 500 MG/5ML IV SOLN
500.0000 mg | Freq: Once | INTRAVENOUS | Status: AC
  Administered 2019-07-31: 500 mg via INTRAVENOUS
  Filled 2019-07-31: qty 5

## 2019-07-31 MED ORDER — LORAZEPAM 2 MG/ML IJ SOLN
INTRAMUSCULAR | Status: AC
  Administered 2019-07-31: 2 mg via INTRAVENOUS
  Filled 2019-07-31: qty 2

## 2019-07-31 NOTE — ED Provider Notes (Signed)
[facility] Emergency Department Provider Note  ____________________________________________   First MD Initiated Contact with Patient 07/31/19 1453     (approximate)  I have reviewed the triage vital signs and the nursing notes.  History  Chief Complaint Seizures    HPI Brooke Hood is a 73 y.o. female with history of dementia, seizures, who presents via EMS from [facility] for a witnessed seizure.  Patient was apparently being assisted for a transfer between toilet/chair when she started seizing.  Seizure lasted less than 7 minutes.  By EMS arrival, she was at her baseline.  Unknown if any anti-epileptics were administered.  The patient herself denies any complaints.  Caveat: History primarily obtained from chart review and EMS, patient has a history of severe dementia.  Per son: has been having seizures for 1-1.5 years now. He thinks this may be her 3rd seizure in the last 6 months.          Past Medical Hx Past Medical History:  Diagnosis Date  . Alzheimer disease ([place])     Problem List There are no active problems to display for this patient.   Past Surgical Hx History reviewed. No pertinent surgical history.  Medications Prior to Admission medications   Medication Sig Start Date End Date Taking? Authorizing Provider  benzonatate (TESSALON) 100 MG capsule Take 100 mg by mouth every 6 (six) hours.    [provider]  divalproex (DEPAKOTE SPRINKLE) 125 MG capsule Take by mouth 2 (two) times daily.    [provider]  docusate sodium (COLACE) 100 MG capsule Take 50 mg by mouth at bedtime.    [provider]  levETIRAcetam (KEPPRA) 250 MG tablet Take 250 mg by mouth 2 (two) times daily.    [provider]  Lidocaine (ASPERCREME MAX STRENGTH) 4 % AERO Apply 1 patch topically 2 (two) times daily.    [provider]  LORazepam (ATIVAN) 1 MG tablet Take 1 mg by mouth 3 (three) times  daily. At 8am, 12pm, 8pm    [provider]  Melatonin 3 MG TABS Take 1 tablet by mouth at bedtime.    [provider]  mirtazapine (REMERON) 15 MG tablet Take 15 mg by mouth at bedtime.    [provider]  risperiDONE (RISPERDAL) 0.5 MG tablet Take 0.5 mg by mouth daily.    [provider]  risperiDONE (RISPERDAL) 0.5 MG tablet Take 0.25 mg by mouth 2 (two) times daily.    [provider]  senna (SENOKOT) 8.6 MG TABS tablet Take 2 tablets by mouth at bedtime.    [provider]  venlafaxine XR (EFFEXOR-XR) 75 MG 24 hr capsule Take 75 mg by mouth daily with breakfast.    [provider]    Allergies Penicillins  Family Hx History reviewed. No pertinent family history.  Social Hx Social History   Tobacco Use  . Smoking status: Unknown If Ever Smoked  . Smokeless tobacco: Never Used  Substance Use Topics  . Alcohol use: Not Currently  . Drug use: Not on file     Review of Systems Unable to obtain due to patient's dementia.   Physical Exam  Vital Signs: ED Triage Vitals  Enc Vitals Group     BP 07/31/19 1507 (!) 106/56     Pulse Rate 07/31/19 1507 74     Resp 07/31/19 1507 20     Temp 07/31/19 1507 98.6 F (37 C)     Temp  Source 07/31/19 1507 Oral     SpO2 07/31/19 1507 95 %     Weight 07/31/19 1455 103 lb 9.9 oz (47 kg)     Height 07/31/19 1455 5\' 2"  (1.575 m)     Head Circumference --      Peak Flow --      Pain Score 07/31/19 1455 0     Pain Loc --      Pain Edu? --      Excl. in GC? --     Constitutional: Alert, awake.  Oriented to self. Eyes: Conjunctivae clear. Sclera anicteric. Head: Normocephalic. Atraumatic. Nose: No congestion. No rhinorrhea. Mouth/Throat: Mucous membranes are slightly dry.  No tongue or lip lacerations. Neck: No stridor.   Cardiovascular: Normal rate, regular rhythm. No murmurs. Extremities well perfused. Respiratory: Normal respiratory effort.  Lungs CTAB.  Gastrointestinal: Soft and non-tender. No distention.  Musculoskeletal: No lower extremity edema. UE and LE non-tender throughout. Neurologic: At baseline dementia.  No gross focal neurologic deficits are appreciated.  Skin: Skin is warm, dry and intact. No rash noted. Psychiatric: Mood and affect are appropriate for situation.  EKG  Personally reviewed.   Rate: 75 Rhythm: sinus Axis: leftward Intervals: WNL No acute ischemic changes No STEMI    Radiology  CT head: IMPRESSION: No acute intracranial abnormality. Chronic small subdural hygromas/hematomas, right more than left, diminished.  Chronic atrophy and chronic small vessel ischemic disease.  XR: IMPRESSION: 1. No radiographic evidence for acute cardiopulmonary abnormality 2. Colonic inter positioning beneath the right diaphragm. Probable gaseous dilatation of stomach in the left upper quadrant though dedicated abdominal radiograph could be obtained to confirm.  Procedures  Procedure(s) performed (including critical care):  Procedures   Initial Impression / Assessment and Plan / ED Course  73 y.o. female with a history of dementia, seizures who presents to the ED for reported witnessed seizure at her living facility. At baseline on arrival to the ED.  Ddx: underlying electrolyte abnormality, infection, known seizure history, subtherapeutic anti-epileptics  Plan: labs, urine, imaging  3:45 PM Upon returning from CT, patient had observed tonic-clonic seizure-like activity, lasting approximately 2 to 3 minutes, ceased with Ativan. Lower lip bit during seizure, but not through and through, no significant defect, does not include vermilion border - repair not required.    After ~10-15 minute post ictal period, patient is again back to her baseline.   Valproic acid level noted to be subtherapeutic, will give IV dose here.  5:08 PM Work-up also reveals significant UTI, which is likely also contributing to her  increase in seizure activity.   I discussed these results with the patient's son, HPOA and discussed admission for further antibiotics and observation given she has seized twice today.  At this time, however, he states that he would prefer to avoid hospitalization for the patient if possible, and would prefer discharge with a course of oral antibiotics.  I discussed that if her seizures were to continue or worsen, she would need to return for reevaluation and possible admission.  He voices understanding of this.  As such, as she back to baseline with no further recurrent seizures, will plan for discharge per son's goals of care. Will give a dose of IV ceftriaxone here, and then plan for discharge on an oral course of antibiotics.     Final Clinical Impression(s) / ED Diagnosis  Final diagnoses:  Seizure (HCC)  Urinary tract infection in elderly patient     Note:  This document was prepared using  [employer]agon [employer] engineer and may include unintentional dictation errors.   Miguel Aschoff., MD 07/31/19 [phone]

## 2019-07-31 NOTE — ED Notes (Signed)
Pt having seizure activity witnessed by CT, MD, and this RN. Pt given 2mg  ativan.

## 2019-07-31 NOTE — Discharge Instructions (Addendum)
Thank you for letting us take care of you in the emergency department today.   Please continue to take any regular, prescribed medications.   New medications we have prescribed:  - Omnicef (cefdinir) - antibiotic for your UTI. Please take the entire course as directed.   - Your primary care doctor to review your ER visit and follow up on your symptoms.   Please return to the ER for any new or worsening symptoms.

## 2019-07-31 NOTE — ED Notes (Signed)
Pt unable to sign due to dementia baseline. Son aware that pt leaving. Report called to [facility].

## 2019-07-31 NOTE — ED Triage Notes (Signed)
Pt comes EMS from [facility] mem care. Pt had witnessed seizure. Pt has hx of seizures. Pt was being assisted to toliet when she fell out seizing. Lasted about 30 secs. Pt able to tell her name after seizure. Pt dementia at baseline.

## 2019-07-31 NOTE — ED Notes (Signed)
Patient transported to CT 

## 2019-08-03 LAB — URINE CULTURE: Culture: >=100000 — AB

## 2019-08-04 LAB — LEVETIRACETAM LEVEL: Levetiracetam Lvl: 11 ug/mL (ref 10.0–40.0)

## 2019-08-11 ENCOUNTER — Other Ambulatory Visit: Payer: Self-pay

## 2019-08-11 ENCOUNTER — Non-Acute Institutional Stay: Payer: Medicare Other | Admitting: Adult Health Nurse Practitioner

## 2019-08-11 DIAGNOSIS — Z515 Encounter for palliative care: Secondary | ICD-10-CM

## 2019-08-11 NOTE — Progress Notes (Signed)
Therapist, nutritional Palliative Care Consult Note Telephone: [phone]  Fax: [phone]  PATIENT NAME: Brooke Hood DOB: Jul 31, 1946 MRN: [id number]  PRIMARY CARE PROVIDER:   Almetta Lovely, [facility]  REFERRING PROVIDER:  [facility] [address] [place],  [place] [address]  RESPONSIBLE PARTY:   Pearlina Friedly; Son [phone]    RECOMMENDATIONS and PLAN:  1.  Advanced Care Planning.  Patient is a DNR.  No changes made today.  Attempted to call son to give update.  Left VM with contact info  2.  Dementia.  FAST 6d.  Patient is able to ambulate with a walker but still requires assistance due to unsteady gait.  She requires assistance with ADLs.  Can feed herself after tray setup.  She is incontinent of B&B.  Can talk but does not engage in conversation.  Does not follow all commands.  Continue to provide supportive care.  3.  Appetite.  Patient's weight as of 08/10/2019 is 106. She was in the ED on 07/31/2019 and weighed 103.  She is having weight gain.  Continue to monitor weights and offer supplements.  4. Seizures.  Patient was in ED on 07/31/2019 for seizure.  She had another seizure at the facility and her depakote levels were found to be sub therapeutic and found to have a UTI.  Son did not want to have her hospitalized at that time. She was given IV depakote and a dose of IV cephtriaxone in the ED and sent back to facility with oral antibiotics.  Continue to monitor for therapeutic levels of depakote and adjust as needed for effective seizure control  I spent 25 minutes providing this consultation,  from 9:55 to 10:20. More than 50% of the time in this consultation was spent coordinating communication.   HISTORY OF PRESENT ILLNESS:  Brooke Hood is a 73 y.o. year old female with multiple medical problems including Vascular dementia, anxiety,seizures. Palliative Care was asked to help address goals of care.   CODE STATUS: DNR  PPS:  40% HOSPICE ELIGIBILITY/DIAGNOSIS: TBD  PHYSICAL EXAM:   General: NAD, frail appearing, thin Cardiovascular: regular rate and rhythm Pulmonary: lung sounds clear; normal respiratory effort Abdomen: soft, nontender, + bowel sounds GU: no suprapubic tenderness Extremities: no edema, no joint deformities Neurological: Weakness; unsteady gait; A&O to self   PAST MEDICAL HISTORY:  Past Medical History:  Diagnosis Date  . Alzheimer disease (HCC)     SOCIAL HX:  Social History   Tobacco Use  . Smoking status: Unknown If Ever Smoked  . Smokeless tobacco: Never Used  Substance Use Topics  . Alcohol use: Not Currently    ALLERGIES:  Allergies  Allergen Reactions  . Penicillins Hives     PERTINENT MEDICATIONS:  Outpatient Encounter Medications as of 08/11/2019  Medication Sig  . benzonatate (TESSALON) 100 MG capsule Take 100 mg by mouth every 6 (six) hours.  . divalproex (DEPAKOTE SPRINKLE) 125 MG capsule Take by mouth 2 (two) times daily.  Marland Kitchen docusate sodium (COLACE) 100 MG capsule Take 50 mg by mouth at bedtime.  . levETIRAcetam (KEPPRA) 250 MG tablet Take 250 mg by mouth 2 (two) times daily.  . Lidocaine (ASPERCREME MAX STRENGTH) 4 % AERO Apply 1 patch topically 2 (two) times daily.  Marland Kitchen LORazepam (ATIVAN) 1 MG tablet Take 1 mg by mouth 3 (three) times daily. At 8am, 12pm, 8pm  . Melatonin 3 MG TABS Take 1 tablet by mouth at bedtime.  . mirtazapine (  REMERON) 15 MG tablet Take 15 mg by mouth at bedtime.  . risperiDONE (RISPERDAL) 0.5 MG tablet Take 0.5 mg by mouth daily.  . risperiDONE (RISPERDAL) 0.5 MG tablet Take 0.25 mg by mouth 2 (two) times daily.  Marland Kitchen senna (SENOKOT) 8.6 MG TABS tablet Take 2 tablets by mouth at bedtime.  Marland Kitchen venlafaxine XR (EFFEXOR-XR) 75 MG 24 hr capsule Take 75 mg by mouth daily with breakfast.   No facility-administered encounter medications on file as of 08/11/2019.      Neita Landrigan Jenetta Downer, NP

## 2019-09-12 IMAGING — CR PORTABLE CHEST - 1 VIEW
1 series · 1 of 1 positions shown · non-contrast
Comparison: None.

CLINICAL DATA: Seizure.

EXAM:
PORTABLE CHEST 1 VIEW

[chest ap]
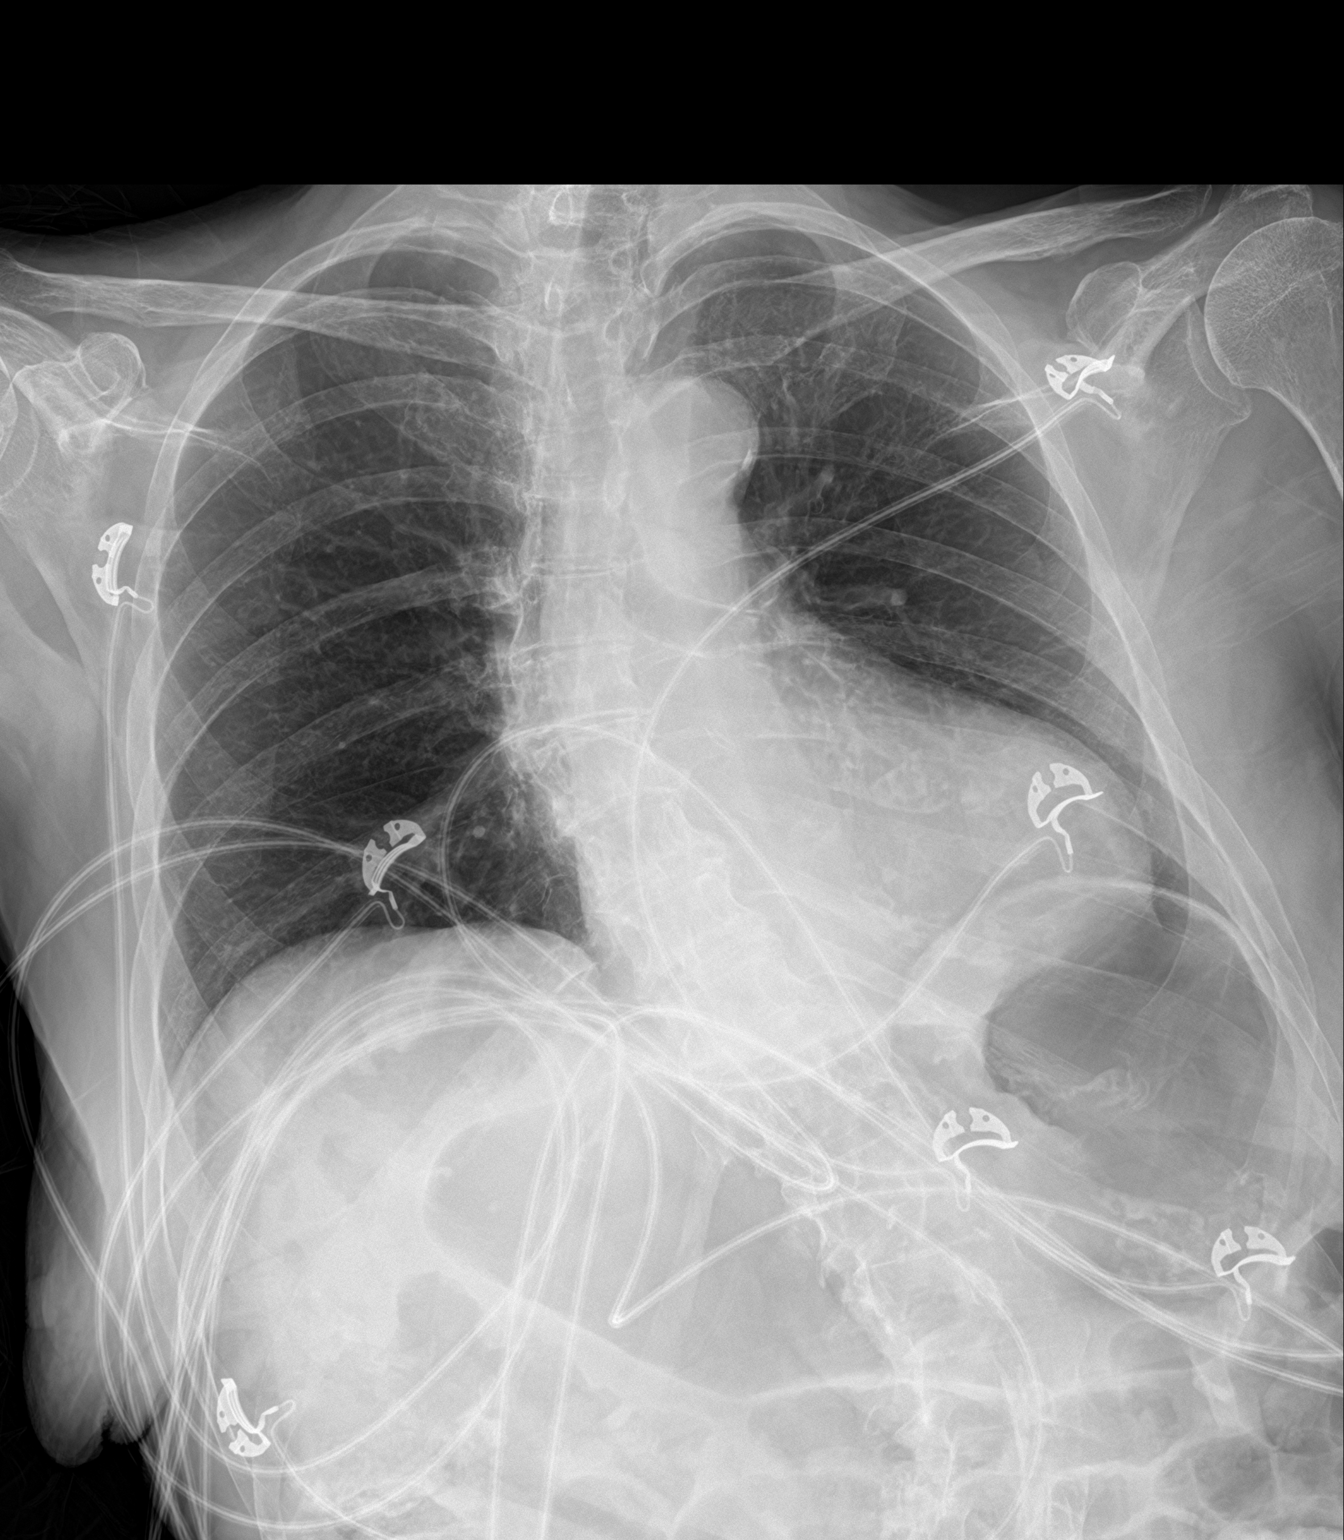

[1 of 1 positions shown; findings below may reference images not displayed]

FINDINGS: The heart size is mildly enlarged. Aortic calcifications are noted.
There is no pneumothorax. There is no large pleural effusion. There
is some gaseous distention of the small bowel, stomach, and colon in
the upper abdomen. There is no evidence for displaced fracture.
IMPRESSION: No active disease.

## 2019-10-20 ENCOUNTER — Inpatient Hospital Stay
Admission: EM | Admit: 2019-10-20 | Discharge: 2019-10-22 | DRG: 640 | Disposition: A | Discharge status: Hospice | Payer: Medicare Other | Source: Skilled Nursing Facility | Attending: Family Medicine | Admitting: Family Medicine

## 2019-10-20 ENCOUNTER — Emergency Department: Payer: Medicare Other

## 2019-10-20 ENCOUNTER — Other Ambulatory Visit: Payer: Self-pay

## 2019-10-20 ENCOUNTER — Inpatient Hospital Stay: Payer: Medicare Other

## 2019-10-20 DIAGNOSIS — Z515 Encounter for palliative care: Secondary | ICD-10-CM | POA: Diagnosis present

## 2019-10-20 DIAGNOSIS — Z681 Body mass index (BMI) 19 or less, adult: Secondary | ICD-10-CM | POA: Diagnosis not present

## 2019-10-20 DIAGNOSIS — R64 Cachexia: Secondary | ICD-10-CM | POA: Diagnosis present

## 2019-10-20 DIAGNOSIS — E43 Unspecified severe protein-calorie malnutrition: Secondary | ICD-10-CM | POA: Diagnosis present

## 2019-10-20 DIAGNOSIS — E860 Dehydration: Secondary | ICD-10-CM | POA: Diagnosis not present

## 2019-10-20 DIAGNOSIS — R9431 Abnormal electrocardiogram [ECG] [EKG]: Secondary | ICD-10-CM | POA: Diagnosis present

## 2019-10-20 DIAGNOSIS — E870 Hyperosmolality and hypernatremia: Secondary | ICD-10-CM | POA: Diagnosis present

## 2019-10-20 DIAGNOSIS — Z7401 Bed confinement status: Secondary | ICD-10-CM | POA: Diagnosis not present

## 2019-10-20 DIAGNOSIS — E861 Hypovolemia: Secondary | ICD-10-CM | POA: Diagnosis present

## 2019-10-20 DIAGNOSIS — G9341 Metabolic encephalopathy: Secondary | ICD-10-CM | POA: Diagnosis present

## 2019-10-20 DIAGNOSIS — R4182 Altered mental status, unspecified: Secondary | ICD-10-CM

## 2019-10-20 DIAGNOSIS — E872 Acidosis, unspecified: Secondary | ICD-10-CM | POA: Diagnosis present

## 2019-10-20 DIAGNOSIS — L89151 Pressure ulcer of sacral region, stage 1: Secondary | ICD-10-CM | POA: Diagnosis present

## 2019-10-20 DIAGNOSIS — G40909 Epilepsy, unspecified, not intractable, without status epilepticus: Secondary | ICD-10-CM | POA: Diagnosis present

## 2019-10-20 DIAGNOSIS — G309 Alzheimer's disease, unspecified: Secondary | ICD-10-CM | POA: Diagnosis present

## 2019-10-20 DIAGNOSIS — Z79890 Hormone replacement therapy (postmenopausal): Secondary | ICD-10-CM | POA: Diagnosis not present

## 2019-10-20 DIAGNOSIS — S065X9A Traumatic subdural hemorrhage with loss of consciousness of unspecified duration, initial encounter: Secondary | ICD-10-CM | POA: Diagnosis not present

## 2019-10-20 DIAGNOSIS — Z20828 Contact with and (suspected) exposure to other viral communicable diseases: Secondary | ICD-10-CM | POA: Diagnosis present

## 2019-10-20 DIAGNOSIS — Y95 Nosocomial condition: Secondary | ICD-10-CM | POA: Diagnosis present

## 2019-10-20 DIAGNOSIS — S065XAA Traumatic subdural hemorrhage with loss of consciousness status unknown, initial encounter: Secondary | ICD-10-CM | POA: Diagnosis present

## 2019-10-20 DIAGNOSIS — Z66 Do not resuscitate: Secondary | ICD-10-CM | POA: Diagnosis present

## 2019-10-20 DIAGNOSIS — R569 Unspecified convulsions: Secondary | ICD-10-CM

## 2019-10-20 DIAGNOSIS — F0280 Dementia in other diseases classified elsewhere without behavioral disturbance: Secondary | ICD-10-CM | POA: Diagnosis present

## 2019-10-20 DIAGNOSIS — R4189 Other symptoms and signs involving cognitive functions and awareness: Secondary | ICD-10-CM | POA: Diagnosis present

## 2019-10-20 DIAGNOSIS — R627 Adult failure to thrive: Secondary | ICD-10-CM | POA: Diagnosis present

## 2019-10-20 DIAGNOSIS — L8990 Pressure ulcer of unspecified site, unspecified stage: Secondary | ICD-10-CM | POA: Insufficient documentation

## 2019-10-20 DIAGNOSIS — Z7189 Other specified counseling: Secondary | ICD-10-CM | POA: Diagnosis not present

## 2019-10-20 DIAGNOSIS — J189 Pneumonia, unspecified organism: Secondary | ICD-10-CM | POA: Diagnosis present

## 2019-10-20 DIAGNOSIS — I4581 Long QT syndrome: Secondary | ICD-10-CM | POA: Diagnosis present

## 2019-10-20 DIAGNOSIS — I517 Cardiomegaly: Secondary | ICD-10-CM | POA: Diagnosis present

## 2019-10-20 DIAGNOSIS — I6203 Nontraumatic chronic subdural hemorrhage: Secondary | ICD-10-CM | POA: Diagnosis present

## 2019-10-20 DIAGNOSIS — A419 Sepsis, unspecified organism: Secondary | ICD-10-CM

## 2019-10-20 DIAGNOSIS — Z79899 Other long term (current) drug therapy: Secondary | ICD-10-CM | POA: Diagnosis not present

## 2019-10-20 DIAGNOSIS — J9601 Acute respiratory failure with hypoxia: Secondary | ICD-10-CM | POA: Diagnosis present

## 2019-10-20 DIAGNOSIS — R404 Transient alteration of awareness: Secondary | ICD-10-CM | POA: Diagnosis not present

## 2019-10-20 DIAGNOSIS — Z880 Allergy status to penicillin: Secondary | ICD-10-CM

## 2019-10-20 LAB — BASIC METABOLIC PANEL
Anion gap: 15 (ref 5–15)
Anion gap: 11 (ref 5–15)
Anion gap: 8 (ref 5–15)
BUN: 32 mg/dL — ABNORMAL HIGH (ref 8–23)
BUN: 30 mg/dL — ABNORMAL HIGH (ref 8–23)
BUN: 25 mg/dL — ABNORMAL HIGH (ref 8–23)
CO2: 33 mmol/L — ABNORMAL HIGH (ref 22–32)
CO2: 34 mmol/L — ABNORMAL HIGH (ref 22–32)
CO2: 31 mmol/L (ref 22–32)
Calcium: 9.2 mg/dL (ref 8.9–10.3)
Calcium: 8.7 mg/dL — ABNORMAL LOW (ref 8.9–10.3)
Calcium: 8.1 mg/dL — ABNORMAL LOW (ref 8.9–10.3)
Chloride: 106 mmol/L (ref 98–111)
Chloride: 109 mmol/L (ref 98–111)
Chloride: 111 mmol/L (ref 98–111)
Creatinine, Ser: 0.72 mg/dL (ref 0.44–1.00)
Creatinine, Ser: 0.7 mg/dL (ref 0.44–1.00)
Creatinine, Ser: 0.5 mg/dL (ref 0.44–1.00)
GFR calc Af Amer: >60 mL/min (ref >60)
GFR calc Af Amer: >60 mL/min (ref >60)
GFR calc Af Amer: >60 mL/min (ref >60)
GFR calc non Af Amer: >60 mL/min (ref >60)
GFR calc non Af Amer: >60 mL/min (ref >60)
GFR calc non Af Amer: >60 mL/min (ref >60)
Glucose, Bld: 107 mg/dL — ABNORMAL HIGH (ref 70–99)
Glucose, Bld: 103 mg/dL — ABNORMAL HIGH (ref 70–99)
Glucose, Bld: 139 mg/dL — ABNORMAL HIGH (ref 70–99)
Potassium: 3.5 mmol/L (ref 3.5–5.1)
Potassium: 3.7 mmol/L (ref 3.5–5.1)
Potassium: 3 mmol/L — ABNORMAL LOW (ref 3.5–5.1)
Sodium: 154 mmol/L — ABNORMAL HIGH (ref 135–145)
Sodium: 154 mmol/L — ABNORMAL HIGH (ref 135–145)
Sodium: 150 mmol/L — ABNORMAL HIGH (ref 135–145)

## 2019-10-20 LAB — SARS CORONAVIRUS 2 (TAT 6-24 HRS): SARS Coronavirus 2: NEGATIVE

## 2019-10-20 LAB — URINALYSIS, COMPLETE (UACMP) WITH MICROSCOPIC
Bacteria, UA: NONE SEEN
Bilirubin Urine: NEGATIVE
Glucose, UA: NEGATIVE mg/dL
Ketones, ur: 80 mg/dL — AB
Leukocytes,Ua: NEGATIVE
Nitrite: NEGATIVE
Protein, ur: 30 mg/dL — AB
RBC / HPF: >50 RBC/hpf — ABNORMAL HIGH (ref 0–5)
Specific Gravity, Urine: 1.031 — ABNORMAL HIGH (ref 1.005–1.030)
Squamous Epithelial / HPF: NONE SEEN (ref 0–5)
pH: 6 (ref 5.0–8.0)

## 2019-10-20 LAB — CBC WITH DIFFERENTIAL/PLATELET
Abs Immature Granulocytes: 0.01 10*3/uL (ref 0.00–0.07)
Basophils Absolute: 0 10*3/uL (ref 0.0–0.1)
Basophils Relative: 0 %
Eosinophils Absolute: 0 10*3/uL (ref 0.0–0.5)
Eosinophils Relative: 0 %
HCT: 46.2 % — ABNORMAL HIGH (ref 36.0–46.0)
Hemoglobin: 14.1 g/dL (ref 12.0–15.0)
Immature Granulocytes: 0 %
Lymphocytes Relative: 14 %
Lymphs Abs: 1.2 10*3/uL (ref 0.7–4.0)
MCH: 28.8 pg (ref 26.0–34.0)
MCHC: 30.5 g/dL (ref 30.0–36.0)
MCV: 94.5 fL (ref 80.0–100.0)
Monocytes Absolute: 0.4 10*3/uL (ref 0.1–1.0)
Monocytes Relative: 5 %
Neutro Abs: 6.9 10*3/uL (ref 1.7–7.7)
Neutrophils Relative %: 81 %
Platelets: 179 10*3/uL (ref 150–400)
RBC: 4.89 MIL/uL (ref 3.87–5.11)
RDW: 12.1 % (ref 11.5–15.5)
WBC: 8.5 10*3/uL (ref 4.0–10.5)
nRBC: 0 % (ref 0.0–0.2)

## 2019-10-20 LAB — PROTIME-INR
INR: 1.1 (ref 0.8–1.2)
Prothrombin Time: 14 seconds (ref 11.4–15.2)

## 2019-10-20 LAB — TROPONIN I (HIGH SENSITIVITY)
Troponin I (High Sensitivity): 7 ng/L (ref <18)
Troponin I (High Sensitivity): 6 ng/L (ref <18)

## 2019-10-20 LAB — SODIUM, URINE, RANDOM: Sodium, Ur: <10 mmol/L

## 2019-10-20 LAB — LACTIC ACID, PLASMA
Lactic Acid, Venous: 3.8 mmol/L (ref 0.5–1.9)
Lactic Acid, Venous: 2.7 mmol/L (ref 0.5–1.9)
Lactic Acid, Venous: 2 mmol/L (ref 0.5–1.9)

## 2019-10-20 LAB — MRSA PCR SCREENING: MRSA by PCR: NEGATIVE

## 2019-10-20 LAB — HEPATIC FUNCTION PANEL
ALT: 10 U/L (ref 0–44)
AST: 16 U/L (ref 15–41)
Albumin: 3.2 g/dL — ABNORMAL LOW (ref 3.5–5.0)
Alkaline Phosphatase: 55 U/L (ref 38–126)
Bilirubin, Direct: <0.1 mg/dL (ref 0.0–0.2)
Total Bilirubin: 0.8 mg/dL (ref 0.3–1.2)
Total Protein: 7.1 g/dL (ref 6.5–8.1)

## 2019-10-20 LAB — OSMOLALITY: Osmolality: 328 mOsm/kg (ref 275–295)

## 2019-10-20 LAB — POC SARS CORONAVIRUS 2 AG: SARS Coronavirus 2 Ag: NEGATIVE

## 2019-10-20 LAB — OSMOLALITY, URINE: Osmolality, Ur: 1078 mOsm/kg — ABNORMAL HIGH (ref 300–900)

## 2019-10-20 LAB — VALPROIC ACID LEVEL: Valproic Acid Lvl: 58 ug/mL (ref 50.0–100.0)

## 2019-10-20 LAB — PROCALCITONIN: Procalcitonin: <0.1 ng/mL

## 2019-10-20 LAB — FIBRIN DERIVATIVES D-DIMER (ARMC ONLY): Fibrin derivatives D-dimer (ARMC): 599.18 ng/mL (FEU) — ABNORMAL HIGH (ref 0.00–499.00)

## 2019-10-20 MED ORDER — SODIUM CHLORIDE 0.9 % IV SOLN: 2.0000 g | Freq: Once | INTRAVENOUS | Status: DC

## 2019-10-20 MED ORDER — DEXTROSE-NACL 5-0.45 % IV SOLN
INTRAVENOUS | Status: DC
  Administered 2019-10-20 – 2019-10-21 (×2): via INTRAVENOUS

## 2019-10-20 MED ORDER — VALPROATE SODIUM 500 MG/5ML IV SOLN
125.0000 mg | Freq: Two times a day (BID) | INTRAVENOUS | Status: DC
  Administered 2019-10-20 – 2019-10-21 (×3): 125 mg via INTRAVENOUS
  Filled 2019-10-20 (×5): qty 1.25

## 2019-10-20 MED ORDER — LORAZEPAM 2 MG/ML IJ SOLN: 1.0000 mg | INTRAMUSCULAR | Status: DC | PRN

## 2019-10-20 MED ORDER — ACETAMINOPHEN 325 MG PO TABS: 650.0000 mg | ORAL_TABLET | Freq: Four times a day (QID) | ORAL | Status: DC | PRN

## 2019-10-20 MED ORDER — POTASSIUM CHLORIDE 10 MEQ/100ML IV SOLN
10.0000 meq | INTRAVENOUS | Status: AC
  Administered 2019-10-20 (×4): 10 meq via INTRAVENOUS
  Filled 2019-10-20 (×4): qty 100

## 2019-10-20 MED ORDER — IOHEXOL 300 MG/ML  SOLN
75.0000 mL | Freq: Once | INTRAMUSCULAR | Status: AC | PRN
  Administered 2019-10-20: 75 mL via INTRAVENOUS

## 2019-10-20 MED ORDER — LORAZEPAM 2 MG/ML IJ SOLN: 0.5000 mg | Freq: Three times a day (TID) | INTRAMUSCULAR | Status: DC

## 2019-10-20 MED ORDER — ACETAMINOPHEN 650 MG RE SUPP: 650.0000 mg | Freq: Four times a day (QID) | RECTAL | Status: DC | PRN

## 2019-10-20 MED ORDER — SODIUM CHLORIDE 0.9 % IV SOLN
2.0000 g | Freq: Once | INTRAVENOUS | Status: AC
  Administered 2019-10-20: 2 g via INTRAVENOUS
  Filled 2019-10-20: qty 2

## 2019-10-20 MED ORDER — SODIUM CHLORIDE 0.9 % IV BOLUS (SEPSIS)
1000.0000 mL | Freq: Once | INTRAVENOUS | Status: AC
  Administered 2019-10-20: 1000 mL via INTRAVENOUS

## 2019-10-20 MED ORDER — SODIUM CHLORIDE 0.9 % IV SOLN
250.0000 mg | Freq: Two times a day (BID) | INTRAVENOUS | Status: DC
  Administered 2019-10-20 – 2019-10-22 (×5): 250 mg via INTRAVENOUS
  Filled 2019-10-20 (×8): qty 2.5

## 2019-10-20 MED ORDER — SODIUM CHLORIDE 0.9 % IV SOLN
2.0000 g | Freq: Two times a day (BID) | INTRAVENOUS | Status: DC
  Administered 2019-10-20: 2 g via INTRAVENOUS
  Filled 2019-10-20 (×2): qty 2

## 2019-10-20 MED ORDER — VANCOMYCIN HCL IN DEXTROSE 1-5 GM/200ML-% IV SOLN: 1000.0000 mg | INTRAVENOUS | Status: DC

## 2019-10-20 MED ORDER — VANCOMYCIN HCL IN DEXTROSE 1-5 GM/200ML-% IV SOLN
1000.0000 mg | Freq: Once | INTRAVENOUS | Status: AC
  Administered 2019-10-20: 1000 mg via INTRAVENOUS
  Filled 2019-10-20: qty 200

## 2019-10-20 MED ORDER — SODIUM CHLORIDE 0.9 % IV BOLUS (SEPSIS)
500.0000 mL | Freq: Once | INTRAVENOUS | Status: AC
  Administered 2019-10-20: 500 mL via INTRAVENOUS

## 2019-10-20 NOTE — ED Notes (Signed)
Assumed care of patient patient non-verbal from [facility], . Patient hospice, incontinent of bladder. Peri care provided, purewhick applied, presents with blanchable redness to sacral area. Patient off lifted off her buttock to prevent stage 1 or any break down of skin. Vital signs stable, NS infusing.  Son at bedside. Safety maintained will continue to monitor.

## 2019-10-20 NOTE — ED Triage Notes (Signed)
Pt arrives to ED via [facility] from [facility] with c/o unresponsiveness. Per EMS, facility called d/t pt having a HR 40's and O2 sats in the 70's. Pt arrives unresponsive with DNR in place and is currently under Hospice care. Dr Beather Arbour at bedside upon pt's arrival to ED.

## 2019-10-20 NOTE — ED Notes (Signed)
2nd rider infusoing. Patient awaiting bed status

## 2019-10-20 NOTE — ED Provider Notes (Addendum)
[facility] Emergency Department Provider Note   ____________________________________________   First MD Initiated Contact with Patient 10/20/19 0319     (approximate)  I have reviewed the triage vital signs and the nursing notes.   HISTORY  Chief Complaint Altered Mental Status  Level V caveat: limited by unresponsive state  HPI Kenslei Hearty is a 73 y.o. female brought to the ED via EMS from [place] with a chief complaint of unresponsive, hypoxic and bradycardic.  Patient has a history of seizures, Alzheimer's dementia who is on hospice care.  DNR in place.  Staff reports patient had a change in her clinical status overnight.  They called the hospice nurse who was unable to come out to the facility so they sent the patient to the emergency department.  Patient arrives unresponsive and hypoxic with thready pulse.       Past Medical History:  Diagnosis Date  . Alzheimer disease [facility])     Patient Active Problem List   Diagnosis Date Noted  . Unresponsiveness 10/20/2019  . Seizure ([place]) 10/20/2019  . Hypernatremia 10/20/2019  . Acute respiratory failure with hypoxia ([place]) 10/20/2019  . QT prolongation 10/20/2019    History reviewed. No pertinent surgical history.  Prior to Admission medications   Medication Sig Start Date End Date Taking? Authorizing Provider  benzonatate (TESSALON) 100 MG capsule Take 100 mg by mouth every 6 (six) hours.    [provider]  divalproex (DEPAKOTE SPRINKLE) 125 MG capsule Take by mouth 2 (two) times daily.    [provider]  docusate sodium (COLACE) 100 MG capsule Take 50 mg by mouth at bedtime.    [provider]  levETIRAcetam (KEPPRA) 250 MG tablet Take 250 mg by mouth 2 (two) times daily.    [provider]  Lidocaine (ASPERCREME MAX STRENGTH) 4 % AERO Apply 1 patch topically 2 (two) times daily.    [provider]  LORazepam (ATIVAN) 1 MG tablet Take 1 mg by  mouth 3 (three) times daily. At 8am, 12pm, 8pm    [provider]  Melatonin 3 MG TABS Take 1 tablet by mouth at bedtime.    [provider]  mirtazapine (REMERON) 15 MG tablet Take 15 mg by mouth at bedtime.    [provider]  risperiDONE (RISPERDAL) 0.5 MG tablet Take 0.5 mg by mouth daily.    [provider]  risperiDONE (RISPERDAL) 0.5 MG tablet Take 0.25 mg by mouth 2 (two) times daily.    [provider]  senna (SENOKOT) 8.6 MG TABS tablet Take 2 tablets by mouth at bedtime.    [provider]  venlafaxine XR (EFFEXOR-XR) 75 MG 24 hr capsule Take 75 mg by mouth daily with breakfast.    [provider]    Allergies Penicillins  No family history on file.  Social History Social History   Tobacco Use  . Smoking status: Unknown If Ever Smoked  . Smokeless tobacco: Never Used  Substance Use Topics  . Alcohol use: Not Currently  . Drug use: Not on file    Review of Systems  Constitutional: No fever/chills Eyes: No visual changes. ENT: No sore throat. Cardiovascular: Denies chest pain. Respiratory: Positive for hypoxia.  Denies shortness of breath. Gastrointestinal: No abdominal pain.  No nausea, no vomiting.  No diarrhea.  No constipation. Genitourinary: Negative for dysuria. Musculoskeletal: Negative for back pain. Skin: Negative for rash. Neurological: Negative for headaches, focal weakness or numbness.   ____________________________________________  PHYSICAL EXAM:  VITAL SIGNS: ED Triage Vitals  Enc Vitals Group     BP      Pulse      Resp      Temp      Temp src      SpO2      Weight      Height      Head Circumference      Peak Flow      Pain Score      Pain Loc      Pain Edu?      Excl. in GC?     Constitutional: Unresponsive, cachectic. Eyes: Conjunctivae are normal. PERRL. EOMI. Head: Atraumatic. Nose: No congestion/rhinnorhea. Mouth/Throat: Mucous membranes are dry.   Neck: No  stridor.   Cardiovascular: Normal rate, regular rhythm. Grossly normal heart sounds.  Good peripheral circulation. Respiratory: Normal respiratory effort.  No retractions.  Gastrointestinal: Soft and nontender. No distention. No abdominal bruits. No CVA tenderness. Musculoskeletal: No lower extremity edema.  No joint effusions. Neurologic: Unresponsive.  No response to sternal rub.   Skin:  Skin is warm, dry and intact. No rash noted. Psychiatric: Unable to assess.  ____________________________________________   LABS (all labs ordered are listed, but only abnormal results are displayed)  Labs Reviewed  LACTIC ACID, PLASMA - Abnormal; Notable for the following components:      Result Value   Lactic Acid, Venous 3.8 (*)    All other components within normal limits  LACTIC ACID, PLASMA - Abnormal; Notable for the following components:   Lactic Acid, Venous 2.7 (*)    All other components within normal limits  CBC WITH DIFFERENTIAL/PLATELET - Abnormal; Notable for the following components:   HCT 46.2 (*)    All other components within normal limits  BASIC METABOLIC PANEL - Abnormal; Notable for the following components:   Sodium 154 (*)    CO2 33 (*)    Glucose, Bld 107 (*)    BUN 32 (*)    All other components within normal limits  URINALYSIS, COMPLETE (UACMP) WITH MICROSCOPIC - Abnormal; Notable for the following components:   Color, Urine AMBER (*)    APPearance HAZY (*)    Specific Gravity, Urine 1.031 (*)    Hgb urine dipstick SMALL (*)    Ketones, ur 80 (*)    Protein, ur 30 (*)    RBC / HPF >50 (*)    All other components within normal limits  FIBRIN DERIVATIVES D-DIMER ([facility] ONLY) - Abnormal; Notable for the following components:   Fibrin derivatives D-dimer ([facility]) 599.18 (*)    All other components within normal limits  HEPATIC FUNCTION PANEL - Abnormal; Notable for the following components:   Albumin 3.2 (*)    All other components within normal limits  CULTURE,  BLOOD (ROUTINE X 2)  CULTURE, BLOOD (ROUTINE X 2)  URINE CULTURE  SARS CORONAVIRUS 2 (TAT 6-24 HRS)  VALPROIC ACID LEVEL  BASIC METABOLIC PANEL  POC SARS CORONAVIRUS 2 AG -  ED  POC SARS CORONAVIRUS 2 AG  TROPONIN I (HIGH SENSITIVITY)  TROPONIN I (HIGH SENSITIVITY)   ____________________________________________  EKG  ED ECG REPORT I, Genita Nilsson J, the attending physician, personally viewed and interpreted this ECG.   Date: 10/20/2019  EKG Time: 0337  Rate: 70  Rhythm: normal EKG, normal sinus rhythm  Axis: Normal  Intervals:none  ST&T Change: Nonspecific  ____________________________________________  RADIOLOGY  ED MD interpretation: Left lower lobe pneumonia  [facility] radi[facility](s[facility]  Result Date: 10/20/2019 CLINICAL DATA:  Unresponsive. Bradycardia. Hypoxia. EXAM: PORTABLE CHEST 1 VIEW COMPARISON:  One-view chest x-ray 07/31/2019 FINDINGS: The heart is enlarged. There is no edema or effusion. There is partial collapse of the left lower lobe. The right lung is clear. Scoliosis is again noted. IMPRESSION: 1. Stable cardiomegaly without failure. 2. Partial collapse of the left lower lobe. While this may represent atelectasis, infection is also considered. Electronically Signed   By: Marin Roberts M.D.   On: 10/20/2019 04:20    ____________________________________________   PROCEDURES  Procedure(s) performed (including Critical Care):  Procedures  CRITICAL CARE Performed by: Irean Hong   Total critical care time: 45 minutes  Critical care time was exclusive of separately billable procedures and treating other patients.  Critical care was necessary to treat or prevent imminent or life-threatening deterioration.  Critical care was time spent personally by me on the following activities: development of treatment plan with patient and/or surrogate as well as nursing, discussions with consultants, evaluation of patient's response to  treatment, examination of patient, obtaining history from patient or surrogate, ordering and performing treatments and interventions, ordering and review of laboratory studies, ordering and review of radiographic studies, pulse oximetry and re-evaluation of patient's condition.  ____________________________________________   INITIAL IMPRESSION / ASSESSMENT AND PLAN / ED COURSE  As part of my medical decision making, I reviewed the following data within the electronic MEDICAL RECORD NUMBER Nursing notes reviewed and incorporated, Labs reviewed, EKG interpreted, Old chart reviewed, Radiograph reviewed and Notes from prior ED visits     Brooke Hood was evaluated in Emergency Department on 10/20/2019 for the symptoms described in the history of present illness. She was evaluated in the context of the global COVID-19 pandemic, which necessitated consideration that the patient might be at risk for infection with the SARS-CoV-2 virus that causes COVID-19. Institutional protocols and algorithms that pertain to the evaluation of patients at risk for COVID-19 are in a state of rapid change based on information released by regulatory bodies including the CDC and federal and state organizations. These policies and algorithms were followed during the patient's care in the ED.    73 year old female on hospice care sent for unresponsive state, hypoxia and bradycardia. Differential includes, but is not limited to, viral syndrome, bronchitis including COPD exacerbation, pneumonia, reactive airway disease including asthma, CHF including exacerbation with or without pulmonary/interstitial edema, pneumothorax, ACS, thoracic trauma, and pulmonary embolism.  Patient arrives with DNR in place.  Will contact patient's son as well as hospice nurse to obtain collateral information.   Clinical Course as of Oct 19 714  Wed Oct 20, 2019  6045 Left a message for patient's son, Havana Baldwin at [phone] to call back to discuss his  wishes for level of work-up in the emergency department.   [JS]  8295 According to hospice nurse on call, patient is under palliative care only and not hospice.   [JS]  0456 Lactic acid noted.  Patient is also hypernatremic.  Will discuss case with hospitalist services for admission.   [JS]  0459 Rapid Covid antigen is NEGATIVE.   [JS]  0715 Patient son called back and I updated him of her status.  He did verify that patient is DO NOT RESUSCITATE.   [JS]    Clinical Course User Index [JS] Irean Hong, MD     ____________________________________________   FINAL CLINICAL IMPRESSION(S) / ED DIAGNOSES  Final diagnoses:  Altered mental status, unspecified altered mental status  type  HCAP (healthcare-associated pneumonia)  Sepsis, due to unspecified organism, unspecified whether acute organ dysfunction present [facility])  Dehydration  Hypernatremia     ED Discharge Orders    None       Note:  This document was prepared using Dragon voice recognition software and may include unintentional dictation errors.   Irean Hong, MD 10/20/19 4818    Irean Hong, MD 11/04/19 0010

## 2019-10-20 NOTE — ED Notes (Signed)
Dr Beather Arbour made aware at this time of pt's elevated Lactic Acid level as reported by lab. Lactic Acid 3.8 mmol/L

## 2019-10-20 NOTE — ED Notes (Signed)
1st run of k-rider received and started.

## 2019-10-20 NOTE — ED Notes (Signed)
COVID completed 12/2 - results negative

## 2019-10-20 NOTE — ED Notes (Signed)
DNR bracelet on right wrist

## 2019-10-20 NOTE — ED Notes (Signed)
Repositioned patient with cushion under her right side. Pt resting comfortably. Will reposition again in 2 hours.

## 2019-10-20 NOTE — Plan of Care (Signed)
73 yo female with Dementia, DNR coming from [facility] w apparently unresponsiveness with o2 sat in the 70's.    Wbc 8.5, Hgb 14.1, Plt 179 Lactic acid 3.8 Na 154, K 3.5,  Bun 32, Creatinine 0.72  CXR IMPRESSION: 1. Stable cardiomegaly without failure. 2. Partial collapse of the left lower lobe. While this may represent atelectasis, infection is also considered.  Adding LFT, D dimer and please f/u on Covid status.   ED requesting admission for acute respiratory failure w hypoxia, secondary to ? HCAP,  Pt will need r/o COVID

## 2019-10-20 NOTE — ED Notes (Addendum)
Patient sleeping comfortably turned from right to right  side to off left  buttock.

## 2019-10-20 NOTE — H&P (Addendum)
History and Physical    Brooke Hood ZOX:[id number] DOB: [birth year] DOA: 10/20/2019  Referring MD/NP/PA:   PCP: [facility]   Patient coming from:  The patient is coming from from [facility]. At baseline, pt is dependent for most of ADL.        Chief Complaint: Unresponsiveness  HPI: Brooke Hood is a 73 y.o. female with medical history significant of Alzheimer's disease, seizure, under hospice care, DNR, who unresponsiveness.  I called pt's by phone. Since her son is not living with pt, he is not very sure exactly what happened to patient. Per her son, pt recently started hospice care. Patient has Alzheimer's disease and presents with unresponsiveness, and cannot provide medical history, therefore, most of the history is obtained by discussing the case with ED physician, per EMS report, and with the nursing staff.  Per report, pt is from [facility]. Staff reports patient had a change in her clinical status overnight.  They called the hospice nurse who was unable to come out to the facility so they sent the patient to the emergency department. Pt arrives unresponsive. Per EMS, facility called d/t pt having a HR 40's and O2 sats in the 70's. When I saw pt in ED, pt is barely arousable, she moves all extremities on painful stimuli.  Her heart rate is in 60s-70s.  Oxygen saturation 100% on 2.5 L nasal cannula oxygen.  No active nausea, vomiting, diarrhea noted.  No active cough, respiratory distress noted.  ED Course: pt was found to have negative COVID-19 Ag and pending Covid 19 test, WBC 8.5, troponin 7 and 6, lactic acid 3.8, 2.7, negative urinalysis for UTI, creatinine 0.72, BUN 32, sodium 156, temperature 99.6, blood pressure 114/64, valproic acid level 58 which is therapeutic. Chest x-ray showed stable cardiomegaly and partial collapse of left lower lobe versus infiltration. Pt is admitted to MedSurg bed as inpatient with cardiac monitor.   Review of Systems: Could  not be reviewed due to unresponsiveness and Alzheimer's disease  Allergy:  Allergies  Allergen Reactions  . Penicillins Hives    Past Medical History:  Diagnosis Date  . Alzheimer disease (HCC)     History reviewed. No pertinent surgical history.  Could not be reviewed due to unresponsiveness and Alzheimer's disease  Social History:  has an unknown smoking status. She has never used smokeless tobacco. She reports previous alcohol use. No history on file for drug. Could not be reviewed due to unresponsiveness and Alzheimer's disease  Family History: No family history on file. Could not be reviewed due to unresponsiveness and Alzheimer's disease  Prior to Admission medications   Medication Sig Start Date End Date Taking? Authorizing Provider  benzonatate (TESSALON) 100 MG capsule Take 100 mg by mouth every 6 (six) hours.    [provider]  divalproex (DEPAKOTE SPRINKLE) 125 MG capsule Take by mouth 2 (two) times daily.    [provider]  docusate sodium (COLACE) 100 MG capsule Take 50 mg by mouth at bedtime.    [provider]  levETIRAcetam (KEPPRA) 250 MG tablet Take 250 mg by mouth 2 (two) times daily.    [provider]  Lidocaine (ASPERCREME MAX STRENGTH) 4 % AERO Apply 1 patch topically 2 (two) times daily.    [provider]  LORazepam (ATIVAN) 1 MG tablet Take 1 mg by mouth 3 (three) times daily. At 8am, 12pm, 8pm    [provider]  Melatonin 3 MG TABS Take 1 tablet by  mouth at bedtime.    [provider]  mirtazapine (REMERON) 15 MG tablet Take 15 mg by mouth at bedtime.    [provider]  risperiDONE (RISPERDAL) 0.5 MG tablet Take 0.5 mg by mouth daily.    [provider]  risperiDONE (RISPERDAL) 0.5 MG tablet Take 0.25 mg by mouth 2 (two) times daily.    [provider]  senna (SENOKOT) 8.6 MG TABS tablet Take 2 tablets by mouth at bedtime.    [provider]  venlafaxine XR  (EFFEXOR-XR) 75 MG 24 hr capsule Take 75 mg by mouth daily with breakfast.    [provider]    Physical Exam: Vitals:   10/20/19 0730 10/20/19 0745 10/20/19 0815 10/20/19 0815  BP: 115/85 (!) 116/56 (!) 119/59   Pulse: (!) 117 64 61   Resp: 16 12 12    Temp:    (!) 97.4 F (36.3 C)  TempSrc:    Axillary  SpO2: (!) 84% 100% 100%   Weight:      Height:       General: Not in acute distress HEENT:       Eyes: PERRL, EOMI, no scleral icterus.       ENT: No discharge from the ears and nose.       Neck: No JVD, no bruit, no mass felt. Heme: No neck lymph node enlargement. Cardiac: S1/S2, RRR, No murmurs, No gallops or rubs. Respiratory: No rales, wheezing, rhonchi or rubs. GI: Soft, nondistended, questionable tenderness in central abdomen?, no rebound pain, no organomegaly, BS present. GU: No hematuria Ext: No pitting leg edema bilaterally. 1+DP/PT pulse bilaterally. Musculoskeletal: No joint deformities, No joint redness or warmth, no limitation of ROM in spin. Skin: No rashes.  Neuro: Barely arousable, not following command, not oriented X3, cranial nerves II-XII grossly intact, moves all extremities upon painful stimuli Psych: Patient is not psychotic.  Labs on Admission: I have personally reviewed following labs and imaging studies  CBC: Recent Labs  Lab 10/20/19 0347  WBC 8.5  NEUTROABS 6.9  HGB 14.1  HCT 46.2*  MCV 94.5  PLT 179   Basic Metabolic Panel: Recent Labs  Lab 10/20/19 0347  NA 154*  K 3.5  CL 106  CO2 33*  GLUCOSE 107*  BUN 32*  CREATININE 0.72  CALCIUM 9.2   GFR: Estimated Creatinine Clearance: 45.2 mL/min (by C-G formula based on SCr of 0.72 mg/dL). Liver Function Tests: Recent Labs  Lab 10/20/19 0534  AST 16  ALT 10  ALKPHOS 55  BILITOT 0.8  PROT 7.1  ALBUMIN 3.2*   No results for input(s): LIPASE, AMYLASE in the last 168 hours. No results for input(s): AMMONIA in the last 168 hours. Coagulation Profile: No results for  input(s): INR, PROTIME in the last 168 hours. Cardiac Enzymes: No results for input(s): CKTOTAL, CKMB, CKMBINDEX, TROPONINI in the last 168 hours. BNP (last 3 results) No results for input(s): PROBNP in the last 8760 hours. HbA1C: No results for input(s): HGBA1C in the last 72 hours. CBG: No results for input(s): GLUCAP in the last 168 hours. Lipid Profile: No results for input(s): CHOL, HDL, LDLCALC, TRIG, CHOLHDL, LDLDIRECT in the last 72 hours. Thyroid Function Tests: No results for input(s): TSH, T4TOTAL, FREET4, T3FREE, THYROIDAB in the last 72 hours. Anemia Panel: No results for input(s): VITAMINB12, FOLATE, FERRITIN, TIBC, IRON, RETICCTPCT in the last 72 hours. Urine analysis:    Component Value Date/Time   COLORURINE AMBER (A) 10/20/2019 0350   APPEARANCEUR HAZY (  A) 10/20/2019 0350   LABSPEC 1.031 (H) 10/20/2019 0350   PHURINE 6.0 10/20/2019 0350   GLUCOSEU NEGATIVE 10/20/2019 0350   HGBUR SMALL (A) 10/20/2019 0350   BILIRUBINUR NEGATIVE 10/20/2019 0350   KETONESUR 80 (A) 10/20/2019 0350   PROTEINUR 30 (A) 10/20/2019 0350   NITRITE NEGATIVE 10/20/2019 0350   LEUKOCYTESUR NEGATIVE 10/20/2019 0350   Sepsis Labs: (procalcitonin:4,lacticidven:4) )No results found for this or any previous visit (from the past 240 hour(s)).   Radiological Exams on Admission: Ct Head Wo Contrast  Result Date: 10/20/2019 CLINICAL DATA:  Altered level of consciousness. EXAM: CT HEAD WITHOUT CONTRAST TECHNIQUE: Contiguous axial images were obtained from the base of the skull through the vertex without intravenous contrast. COMPARISON:  CT head 07/31/2019 FINDINGS: Brain: Moderate atrophy. Moderate chronic microvascular ischemic change in the white matter. Negative for acute infarct or mass. No acute hemorrhage Bilateral subdural fluid collections are improved. Subdural fluid is slightly more dense than CSF consistent with chronic subdural hematoma. Right-sided collection approximately 4  mm. Left-sided collection approximately 3 mm in thickness. Vascular: Negative for hyperdense vessel Skull: Negative Sinuses/Orbits: Prior sinus surgery. Paranasal sinuses clear. Normal orbit Other: None IMPRESSION: Moderate atrophy and moderate chronic microvascular ischemia. 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 Electronically Signed   By: Marlan Palau M.D.   On: 10/20/2019 09:05   Ct Abdomen Pelvis W Contrast  Result Date: 10/20/2019 CLINICAL DATA:  Acute generalized abdominal pain. Patient is unresponsive hypoxic and bradycardic. EXAM: CT ABDOMEN AND PELVIS WITH CONTRAST TECHNIQUE: Multidetector CT imaging of the abdomen and pelvis was performed using the standard protocol following bolus administration of intravenous contrast. CONTRAST:  75mL OMNIPAQUE IOHEXOL 300 MG/ML  SOLN COMPARISON:  None. FINDINGS: Lower chest: Left basilar airspace consolidation, likely infiltrate. The right lung base is clear. No pleural or pericardial effusion. Hepatobiliary: No focal hepatic lesions or intrahepatic biliary dilatation. The gallbladder contains a 1 cm calcified gallstone but no findings for acute cholecystitis. No common bile duct dilatation. Pancreas: No mass, inflammation or ductal dilatation. Spleen: Normal size.  No focal lesions. Adrenals/Urinary Tract: The adrenal glands and kidneys are unremarkable. The bladder is grossly normal. There is a small amount of gas in the bladder which could be due to recent catheterization. Stomach/Bowel: The stomach, duodenum, small bowel and colon are grossly normal. No obvious acute inflammatory process, mass lesion or obstructive findings. Colonic interposition is noted. Scattered colonic diverticulosis without findings for acute diverticulitis. Vascular/Lymphatic: Advanced atherosclerotic calcifications involving the aorta and branch vessel ostia and iliac arteries but no aneurysm. The major venous structures are patent. No  mesenteric or retroperitoneal mass or adenopathy. No pelvic mass or adenopathy. Reproductive: Surgically absent. Other: There is a 4.9 x 4.0 cm cystic area in the subcutaneous tissues overlying the right hip. This could be an old liquified hematoma. Musculoskeletal: Severe scoliosis. No significant or acute bony findings. Heterotopic ossification noted near the left hip likely related to previous trauma. IMPRESSION: 1. No acute abdominal/pelvic findings, mass lesions or adenopathy. 2. Left lower lobe airspace consolidation, likely infiltrate. 3. Cholelithiasis without findings for acute cholecystitis. 4. Advanced vascular disease. 5. Benign-appearing cystic area in the subcutaneous fat overlying the right hip, likely liquified hematoma. Electronically Signed   By: Rudie Meyer M.D.   On: 10/20/2019 09:15   Dg Chest Port 1 View  Result Date: 10/20/2019 CLINICAL DATA:  Unresponsive. Bradycardia. Hypoxia. EXAM: PORTABLE CHEST 1 VIEW COMPARISON:  One-view chest x-ray 07/31/2019 FINDINGS: The heart is enlarged. There  is no edema or effusion. There is partial collapse of the left lower lobe. The right lung is clear. Scoliosis is again noted. IMPRESSION: 1. Stable cardiomegaly without failure. 2. Partial collapse of the left lower lobe. While this may represent atelectasis, infection is also considered. Electronically Signed   By: Marin Roberts M.D.   On: 10/20/2019 04:20     EKG: Independently reviewed.  Sinus rhythm, QTC 565, LAD, poor R wave progression, low voltage, T wave inversion in V2-V6. Assessment/Plan Principal Problem:   Unresponsiveness Active Problems:   Seizure (HCC)   Hypernatremia   Acute respiratory failure with hypoxia (HCC)   QT prolongation   Lactic acidosis   DNR (do not resuscitate)   Hospice care   SDH (subdural hematoma) (HCC)   Unresponsiveness: Etiology is not clear.  Differential diagnosis include seizure and TIA. No focal neurologic findings on physical  examination. -admit to MedSurg bed as inpatient with cardiac monitor. -Frequent neuro check -get CT-scan of head --> showed a small chronic subdural hematomas with interval improvement. -keep pt NPO until mental status improves  SDH: CT-head showed a small chronic subdural hematomas with interval improvement. -avoid using sq heparin or lovenox -SCD for DVT PPx.  Seizure: Valproic acid level 58 which is therapeutic -Seizure precaution -When necessary Ativan for seizure - check Keppra level -Continue Home medications -->switch to IV Keppra 250 mg twice daily, Depacone125 mg twice daily - pt is on ativan 1 mg tid orally at home -->will switch to 0.5 mg tid by IV to avoid withdraw  Hypernatremia: Na 154.  Most likely due to dehydration. Patient has hypovolemic hypernatremia. -Patient received 1.5 L of normal saline in emergency room.  -Will correct her Na by ~0.4 mEq/h or ~10 mEq/24hour. -will start D5W-1/2 at 75 cc/h - check BMP q8h -check urine and plasma Osmo and urine sodium level  Acute respiratory failure with hypoxia [facility]): Per report, pt had oxygen saturation in the 70s.  Currently patient is saturating 100% on 2.5 L nasal cannula oxygen in ED.  Etiology is not clear.  Chest x-ray showed stable cardiomegaly with partial collapse of left lower lobe vs. Infiltration. CT-abdomen showed left lower lobe airspace consolidation which is likely infiltrate, indicating possible HCAP. Covid 19 Ag negative and pending covid 19 test -will continue vancomycin and cefepime per pharm  -Follow-up blood culture and sputum culture -Nasal cannula oxygen to maintain oxygen saturation above 93%  QT prolongation: -hold Remeron, risperidone, Effexor  Lactic acidosis: lactic acid 3.8 -->2.7. possibly due to dehydration. No fever or leukocytosis, clinically does not seem to be septic.  On physical examination, patient seems to have questionable central abdominal tenderness. -will get Procalcitonin and  trend lactic acid levels -IVF: 1.5 L of NS bolus in ED, followed by 75 cc/h of D5-1/2NS -will get CT-Abdomen/pelvis -->negative for acute intra-abdominal issues  DNR (do not resuscitate) and Hospice care:    Inpatient status:  # Patient requires inpatient status due to high intensity of service, high risk for further deterioration and high frequency of surveillance required.  I certify that at the point of admission it is my clinical judgment that the patient will require inpatient hospital care spanning beyond 2 midnights from the point of admission.  . This patient has multiple chronic comorbidities including Alzheimer's disease, seizure, under hospice care, DNR, who unresponsiveness. . Now patient has presenting with unresponsiveness, hypernatremia,  QTc prolongation, acute respiratory failure with hypoxia,  . The worrisome physical exam findings include unresponsiveness, barely arousable, questionable  abdominal tenderness, lactic acidosis . The initial radiographic and laboratory data are worrisome because of lactic acidosis, hypernatremia, QT prolongation, . Current medical needs: please see my assessment and plan . Predictability of an adverse outcome (risk): Patient multiple comorbidities, now presents with multiple acute issues including  unresponsiveness, hypernatremia,  QTc prolongation, acute respiratory failure with hypoxia.  Her presentation is highly complicated.  Given her old age, patient is at high risk of deteriorating.  Will need to be treated in hospital for at least 2 days.     DVT ppx: SCD Code Status: DNR ( confirmed with her son) Family Communication: I called pt's on       Disposition Plan:  Anticipate discharge back to previous SNF environment Consults called:  none Admission status: med-surg bed with cardiac monitoring    Date of Service 10/20/2019    Ivor Costa [facility]   If 7PM-7AM, please contact night-coverage [email or web address] Password [facility]  10/20/2019, 9:40 AM

## 2019-10-20 NOTE — Consult Note (Signed)
PHARMACY -  BRIEF ANTIBIOTIC NOTE   Pharmacy has received consult(s) for Vancomycin and Aztreonam from an ED provider. Per Aztreonam consult, Pharmacist to investigate beta-lactam allergy. If history of intolerance, mild allergy, or documented history of use of cephalosporins, pharmacy can adjust aztreonam to cefepime.  The patient's profile has been reviewed for ht/wt/allergies/indication/available labs.  Patients allergy to PCNs is reported to be Hives. Patient has tolerated cephalosporins in the past. Patient received ceftriaxone 1g IV on 07/31/2019 in ED.   One time order(s) placed for Cefepime 2g IV and Vancomycin 1g IV  Further antibiotics/pharmacy consults should be ordered by admitting physician if indicated.                       Thank you, Pernell Dupre, PharmD, BCPS Clinical Pharmacist 10/20/2019 5:00 AM

## 2019-10-20 NOTE — Progress Notes (Signed)
CODE SEPSIS - PHARMACY COMMUNICATION  **Broad Spectrum Antibiotics should be administered within 1 hour of Sepsis diagnosis**  Time Code Sepsis Called/Page Received: @ [phone]  Antibiotics Ordered: Cefepime and vancomycin   Time of 1st antibiotic administration: @ [phone]  Additional action taken by pharmacy: N/A   If necessary, Name of Provider/Nurse Contacted: N/A  Pernell Dupre, PharmD, BCPS Clinical Pharmacist 10/20/2019 5:55 AM

## 2019-10-20 NOTE — Consult Note (Signed)
Pharmacy Antibiotic Note  Ardice Boyan is a 73 y.o. female admitted on 10/20/2019 with pneumonia (possible HCAP).    Pharmacy has been consulted for Vancomycin/Cefepime dosing.  Plan: Pt received Cefepime 2g IV and Vancomycin 1g IV this am ~ 0600 in the ED  Will continue Cefepime 2g q12h  Will continue Vancomycin 1000mg  q 36h Goal AUC 400-550. Expected AUC: 491 SCr used: 0.8  Height: 5\' 3"  (160 cm) Weight: 99 lb 3.3 oz (45 kg) IBW/kg (Calculated) : 52.4  Temp (24hrs), Avg:98.4 F (36.9 C), Min:97.4 F (36.3 C), Max:99.4 F (37.4 C)  Recent Labs  Lab 10/20/19 0347 10/20/19 0534  WBC 8.5  --   CREATININE 0.72 0.70  LATICACIDVEN 3.8* 2.7*    Estimated Creatinine Clearance: 45.2 mL/min (by C-G formula based on SCr of 0.7 mg/dL).    Allergies  Allergen Reactions  . Penicillins Hives    Antimicrobials this admission: Cefepime 12/2>> Vancomycin 12/2 >>  Dose adjustments this admission: None  Microbiology results: 12/2 BCx/UCx: pending  12/2 Sputum: pending  12/2 MRSA PCR: pending  Thank you for allowing pharmacy to be a part of this patient's care.  Lu Duffel, PharmD, BCPS Clinical Pharmacist 10/20/2019 9:48 AM

## 2019-10-20 NOTE — ED Notes (Signed)
Pt is unable to follow commands to perform task due to mental status, end stage alzheimers

## 2019-10-20 NOTE — ED Notes (Signed)
3rd k-rider infusing.

## 2019-10-20 NOTE — ED Notes (Signed)
Patient transported to CT 

## 2019-10-20 NOTE — ED Notes (Signed)
Patient resting comfortably pillow moved from left to right to off lift buttocks.

## 2019-10-20 NOTE — ED Notes (Signed)
4th K-rider started.Marland Kitchen

## 2019-10-21 ENCOUNTER — Other Ambulatory Visit: Payer: Self-pay

## 2019-10-21 DIAGNOSIS — E870 Hyperosmolality and hypernatremia: Secondary | ICD-10-CM

## 2019-10-21 DIAGNOSIS — R569 Unspecified convulsions: Secondary | ICD-10-CM

## 2019-10-21 DIAGNOSIS — R4189 Other symptoms and signs involving cognitive functions and awareness: Secondary | ICD-10-CM

## 2019-10-21 DIAGNOSIS — Z7189 Other specified counseling: Secondary | ICD-10-CM

## 2019-10-21 DIAGNOSIS — J9601 Acute respiratory failure with hypoxia: Secondary | ICD-10-CM

## 2019-10-21 DIAGNOSIS — Z66 Do not resuscitate: Secondary | ICD-10-CM

## 2019-10-21 DIAGNOSIS — R9431 Abnormal electrocardiogram [ECG] [EKG]: Secondary | ICD-10-CM

## 2019-10-21 DIAGNOSIS — Z515 Encounter for palliative care: Secondary | ICD-10-CM

## 2019-10-21 DIAGNOSIS — E872 Acidosis: Secondary | ICD-10-CM

## 2019-10-21 DIAGNOSIS — L8990 Pressure ulcer of unspecified site, unspecified stage: Secondary | ICD-10-CM | POA: Insufficient documentation

## 2019-10-21 DIAGNOSIS — S065X9A Traumatic subdural hemorrhage with loss of consciousness of unspecified duration, initial encounter: Secondary | ICD-10-CM

## 2019-10-21 LAB — BASIC METABOLIC PANEL
Anion gap: 8 (ref 5–15)
Anion gap: 7 (ref 5–15)
BUN: 18 mg/dL (ref 8–23)
BUN: 16 mg/dL (ref 8–23)
CO2: 30 mmol/L (ref 22–32)
CO2: 30 mmol/L (ref 22–32)
Calcium: 8.7 mg/dL — ABNORMAL LOW (ref 8.9–10.3)
Calcium: 8.2 mg/dL — ABNORMAL LOW (ref 8.9–10.3)
Chloride: 110 mmol/L (ref 98–111)
Chloride: 111 mmol/L (ref 98–111)
Creatinine, Ser: 0.55 mg/dL (ref 0.44–1.00)
Creatinine, Ser: 0.43 mg/dL — ABNORMAL LOW (ref 0.44–1.00)
GFR calc Af Amer: >60 mL/min (ref >60)
GFR calc Af Amer: >60 mL/min (ref >60)
GFR calc non Af Amer: >60 mL/min (ref >60)
GFR calc non Af Amer: >60 mL/min (ref >60)
Glucose, Bld: 124 mg/dL — ABNORMAL HIGH (ref 70–99)
Glucose, Bld: 128 mg/dL — ABNORMAL HIGH (ref 70–99)
Potassium: 4.3 mmol/L (ref 3.5–5.1)
Potassium: 3.3 mmol/L — ABNORMAL LOW (ref 3.5–5.1)
Sodium: 148 mmol/L — ABNORMAL HIGH (ref 135–145)
Sodium: 148 mmol/L — ABNORMAL HIGH (ref 135–145)

## 2019-10-21 LAB — CBC
HCT: 37.3 % (ref 36.0–46.0)
Hemoglobin: 12.2 g/dL (ref 12.0–15.0)
MCH: 29.3 pg (ref 26.0–34.0)
MCHC: 32.7 g/dL (ref 30.0–36.0)
MCV: 89.7 fL (ref 80.0–100.0)
Platelets: 147 10*3/uL — ABNORMAL LOW (ref 150–400)
RBC: 4.16 MIL/uL (ref 3.87–5.11)
RDW: 12.1 % (ref 11.5–15.5)
WBC: 8.6 10*3/uL (ref 4.0–10.5)
nRBC: 0 % (ref 0.0–0.2)

## 2019-10-21 LAB — URINE CULTURE: Culture: NO GROWTH

## 2019-10-21 LAB — GLUCOSE, CAPILLARY: Glucose-Capillary: 89 mg/dL (ref 70–99)

## 2019-10-21 MED ORDER — ONDANSETRON 4 MG PO TBDP
4.0000 mg | ORAL_TABLET | Freq: Four times a day (QID) | ORAL | Status: DC | PRN
  Filled 2019-10-21: qty 1

## 2019-10-21 MED ORDER — MORPHINE SULFATE (CONCENTRATE) 10 MG/0.5ML PO SOLN: 5.0000 mg | ORAL | Status: DC | PRN

## 2019-10-21 MED ORDER — GLYCOPYRROLATE 1 MG PO TABS
1.0000 mg | ORAL_TABLET | ORAL | Status: DC | PRN
  Filled 2019-10-21: qty 1

## 2019-10-21 MED ORDER — GLYCOPYRROLATE 0.2 MG/ML IJ SOLN: 0.2000 mg | INTRAMUSCULAR | Status: DC | PRN

## 2019-10-21 MED ORDER — SODIUM CHLORIDE 0.9 % IV SOLN
1.0000 g | INTRAVENOUS | Status: DC
  Administered 2019-10-21: 10:00:00 1 g via INTRAVENOUS
  Filled 2019-10-21: qty 1

## 2019-10-21 MED ORDER — POLYVINYL ALCOHOL 1.4 % OP SOLN
1.0000 [drp] | Freq: Four times a day (QID) | OPHTHALMIC | Status: DC | PRN
  Filled 2019-10-21: qty 15

## 2019-10-21 MED ORDER — ONDANSETRON HCL 4 MG/2ML IJ SOLN: 4.0000 mg | Freq: Four times a day (QID) | INTRAMUSCULAR | Status: DC | PRN

## 2019-10-21 NOTE — TOC Progression Note (Signed)
Transition of Care [facility]) - Progression Note    Patient Details  Name: Brooke Hood MRN: [id number] Date of Birth: 07/06/1946  Transition of Care [facility]) CM/SW Contact  Shelbie Hutching, RN Phone Number: 10/21/2019, 12:39 PM  Clinical Narrative:    Patient's son Brooke Hood would like for patient to go to residential hospice and chooses [facility] in [place].  Referral for hospice home given to Flo Shanks with [facility].    Expected Discharge Plan: Hospice Medical Facility Barriers to Discharge: Other (comment)(family meetings and decision making)  Expected Discharge Plan and Services Expected Discharge Plan: [place] In-house Referral: Clinical Social Work   Post Acute Care Choice: Hospice Living arrangements for the past 2 months: [place]                                       Social Determinants of Health (SDOH) Interventions    Readmission Risk Interventions No flowsheet data found.

## 2019-10-21 NOTE — Progress Notes (Signed)
New referral for [facility] hospice home received from [facility] Jeanna Creech. °Writer met in the room with patient's son Ryan to initiate education regarding hospice services, philosophy, team approach to care and current visitation policy with understanding voiced. °Ryan is in agreement with his mother transferring to the hospice home tomorrow evening, pending consents and patient stability. °Patient information faxed to referral. °Will continue to follow and update family and hospital care team. °Karen Robertson BSN, RN, CHPN °Hospital Liaison  °[facility] °[phone] °

## 2019-10-21 NOTE — Plan of Care (Signed)
  Problem: Clinical Measurements: Goal: Will remain free from infection Outcome: Progressing Goal: Respiratory complications will improve Outcome: Progressing Goal: Cardiovascular complication will be avoided Outcome: Progressing   Problem: Coping: Goal: Level of anxiety will decrease Outcome: Progressing   Problem: Elimination: Goal: Will not experience complications related to bowel motility Outcome: Progressing Goal: Will not experience complications related to urinary retention Outcome: Progressing   Problem: Pain Managment: Goal: General experience of comfort will improve Outcome: Progressing   Problem: Safety: Goal: Ability to remain free from injury will improve Outcome: Progressing   Problem: Skin Integrity: Goal: Risk for impaired skin integrity will decrease Outcome: Progressing   

## 2019-10-21 NOTE — Progress Notes (Signed)
PROGRESS NOTE    Brooke Hood  ZOX:[id number] DOB: 1946-06-30 DOA: 10/20/2019 PCP: Almetta Lovely, [facility]      Brief Narrative:  Brooke Hood is a 73 y.o. F with dementia ALF dwelling on Hospice, seizures who presented for unresponsiveness.  Evidently staff noted change in clinical status overnight.  They called the hospice nurse who was unable to come out to the facility so they sent the patient to the emergency department. Pt arrived unresponsive. Per EMS, facility called d/t pt having a HR 40's and O2 sats in the 70's.   In the ER, HR 60s-70s.  Oxygen saturation 100% on 2.5 L nasal cannula oxygen.  Lactic acid 3.8, Na 156, temp 99.14F, CXR with LLL opacity vs collapse.  Cr stable.  Patient started on fluids and admitted.       Assessment & Plan:  Acute metabolic encephalopathy Dementia now at end-of-life with failure to thrive Discussed with hospice medical director, also with son.  Evidently patient has had dementia for 5 or so years, been in assisted living/memory care for some time, and has been progressing in her dementia more rapidly recently.  Up until few months ago, she was still ambulatory, but in the last 2 months, she is no longer able to ambulate, is dependent for feeds, less interactive.  She did have some falls that precipitated this.  2 months ago she was enrolled in hospice, primary diagnosis dementia.  She has clearly been losing weight for considerable amount of time, and per hospice medical director, in the last 2 weeks has been bedbound, and in the last week has stopped eating or drinking entirely.  Hospice were under the impression that she was actively dying.  Evidently overnight 2 nights ago, staff felt the patient was unresponsive and hypotensive, which would have been expected progression, and part of the active dying process.  However, family were unaware which Hospice agency she was enrolled with, called the on-call nurse at the wrong agency, and weren't  able to reach son overnight, and so transported the patient to the ER.  In the ER, she was thought to have metabolic encephalopathy from dehydration/hypernatremia, possibly hypoxia and pneumonia in the context of her end-stage dementia and extremely low functional reserve.   CT head as well as CT abd/pel unremarkable.  However, with more collateral information, it appears that the patient has entered the dying process some days ago, was being cared for by [facility] for this, and is still in that process.  She has had no improvement in status with 24 hours of antibiotisc and fluids.  I feel these therapies are prolonging the dying process, and I recommend we discontinue them, pivot and focus on comfort measures at end of life.   -Consult to Hospice for residential hospice   Acute hypoxic respiratory failure due to possible pneumonia  Sepsis ruled out.  Patient pivoting to comfort measures.    Hypernatremia  Seizure disorder  Pressure injury coccyx, stage I POA  QT prolongation   Severe protein calorie malnutrition       MDM and disposition: The below labs and imaging reports were reviewed and summarized above.  Medication management as above.  The patient was admitted with acute metabolic encephalopathy in the setting of end-stage dementia and dying.    Goals of care and code status were discussed with family.  We will stop antibiotisc/fluids and focus on comfort measures, which are more appropriate at this end stage of her dying process.  DVT prophylaxis: SCDs Code Status: DNR Family Communication: Son at bedside    Consultants:     Procedures:   CT head  CT abdomen and pelvis  Antimicrobials:   Vancomycin and cefepime x1  Ceftriaxone 12/3 >>    Subjective: Patient is nonverbal.  Nursing report no agitation, respiratory distress, fever.  She has a wet cough.  Objective: Vitals:   10/21/19 0010 10/21/19 0025 10/21/19 0100 10/21/19 1001   BP: (!) 116/53 (!) 162/77  (!) 120/59  Pulse: (!) 57 81  (!) 55  Resp:  19  16  Temp: (!) 96.8 F (36 C) 98.7 F (37.1 C)  (!) 97 F (36.1 C)  TempSrc: Axillary Oral  Axillary  SpO2: 100% 99%  100%  Weight:   43.7 kg   Height:    (1.6 m)     Intake/Output Summary (Last 24 hours) at 10/21/2019 1223 Last data filed at 10/21/2019 0351 Gross per 24 hour  Intake 1866.39 ml  Output --  Net 1866.39 ml   Filed Weights   10/20/19 0324 10/21/19 0100  Weight: 45 kg 43.7 kg    Examination: General appearance: Frail, cachectic adult female, appears lethargic.   HEENT: Anicteric, conjunctiva atrophic, with scant discharge, lids and lashes normal. No nasal deformity, discharge, epistaxis.  Lips dry, oropharynx dry, no oral lesions.   Skin: Dry and tented. Cardiac: Tachycardic, regular, nl S1-S2, no murmurs appreciated.   Respiratory: Respiratory rate shallow, lung sounds diminished, no rales or wheezes. Abdomen: Abdomen soft.  No grimace to palpation or involuntary guarding or rigidity. No ascites, distension, hepatosplenomegaly.   MSK: No deformities or effusions.  Diffuse severe loss of subcutaneous muscle mass and fat Neuro: Eyes open, pupils equal and reactive, does not follow commands, no spontaneous verbalizations or movements.  Does not make eye contact, does not follow commands.    Psych: Unable to assess    Data Reviewed: I have personally reviewed following labs and imaging studies:  CBC: Recent Labs  Lab 10/20/19 0347 10/21/19 0548  WBC 8.5 8.6  NEUTROABS 6.9  --   HGB 14.1 12.2  HCT 46.2* 37.3  MCV 94.5 89.7  PLT 179 147*   Basic Metabolic Panel: Recent Labs  Lab 10/20/19 0347 10/20/19 0534 10/20/19 1055 10/21/19 0142 10/21/19 0548  NA 154* 154* 150* 148* 148*  K 3.5 3.7 3.0* 4.3 3.3*  CL 106 109 111 110 111  CO2 33* 34* GLUCOSE 107* 103* 139* 124* 128*  BUN 32* 30* 25* 18 16  CREATININE 0.72 0.70 0.50 0.55 0.43*  CALCIUM 9.2 8.7* 8.1*  8.7* 8.2*   GFR: Estimated Creatinine Clearance: 43.9 mL/min (A) (by C-G formula based on SCr of 0.43 mg/dL (L)). Liver Function Tests: Recent Labs  Lab 10/20/19 0534  AST 16  ALT 10  ALKPHOS 55  BILITOT 0.8  PROT 7.1  ALBUMIN 3.2*   No results for input(s): LIPASE, AMYLASE in the last 168 hours. No results for input(s): AMMONIA in the last 168 hours. Coagulation Profile: Recent Labs  Lab 10/20/19 0534  INR 1.1   Cardiac Enzymes: No results for input(s): CKTOTAL, CKMB, CKMBINDEX, TROPONINI in the last 168 hours. BNP (last 3 results) No results for input(s): PROBNP in the last 8760 hours. HbA1C: No results for input(s): HGBA1C in the last 72 hours. CBG: Recent Labs  Lab 10/21/19 0929  GLUCAP 89   Lipid Profile: No results for input(s): CHOL, HDL, LDLCALC, TRIG, CHOLHDL, LDLDIRECT in the last 72  hours. Thyroid Function Tests: No results for input(s): TSH, T4TOTAL, FREET4, T3FREE, THYROIDAB in the last 72 hours. Anemia Panel: No results for input(s): VITAMINB12, FOLATE, FERRITIN, TIBC, IRON, RETICCTPCT in the last 72 hours. Urine analysis:    Component Value Date/Time   COLORURINE AMBER (A) 10/20/2019 0350   APPEARANCEUR HAZY (A) 10/20/2019 0350   LABSPEC 1.031 (H) 10/20/2019 0350   PHURINE 6.0 10/20/2019 0350   GLUCOSEU NEGATIVE 10/20/2019 0350   HGBUR SMALL (A) 10/20/2019 0350   BILIRUBINUR NEGATIVE 10/20/2019 0350   KETONESUR 80 (A) 10/20/2019 0350   PROTEINUR 30 (A) 10/20/2019 0350   NITRITE NEGATIVE 10/20/2019 0350   LEUKOCYTESUR NEGATIVE 10/20/2019 0350   Sepsis Labs: @LABRCNTIP (procalcitonin:4,lacticacidven:4)  ) Recent Results (from the past 240 hour(s))  Culture, blood (routine x 2)     Status: None (Preliminary result)   Collection Time: 10/20/19  3:47 AM   Specimen: BLOOD  Result Value Ref Range Status   Specimen Description BLOOD RIGHT [facility]  Final   Special Requests   Final    BOTTLES DRAWN AEROBIC AND ANAEROBIC Blood Culture adequate volume    Culture   Final    NO GROWTH 1 DAY Performed at [facility], [address] [place], [place] [address]    Report Status PENDING  Incomplete  Culture, blood (routine x 2)     Status: None (Preliminary result)   Collection Time: 10/20/19  3:50 AM   Specimen: BLOOD  Result Value Ref Range Status   Specimen Description BLOOD LEFT AC  Final   Special Requests   Final    BOTTLES DRAWN AEROBIC AND ANAEROBIC Blood Culture adequate volume   Culture   Final    NO GROWTH < 24 HOURS Performed at [facility], [address] [place], [place] [address]    Report Status PENDING  Incomplete  Urine culture     Status: None   Collection Time: 10/20/19  5:20 AM   Specimen: Urine, Random  Result Value Ref Range Status   Specimen Description   Final    URINE, RANDOM Performed at [facility], [address] [place], [place] [address]    Special Requests   Final    NONE Performed at [facility], [address] [place], [place] [address]    Culture   Final    NO GROWTH Performed at [facility], [address] [place], [place] [address]    Report Status 10/21/2019 FINAL  Final  SARS CORONAVIRUS 2 (TAT 6-24 HRS) Nasopharyngeal Nasopharyngeal Swab     Status: None   Collection Time: 10/20/19  5:48 AM   Specimen: Nasopharyngeal Swab  Result Value Ref Range Status   SARS Coronavirus 2 NEGATIVE NEGATIVE Final    Comment: (NOTE) SARS-CoV-2 target nucleic acids are NOT DETECTED. The SARS-CoV-2 RNA is generally detectable in upper and lower respiratory specimens during the acute phase of infection. Negative results do not preclude SARS-CoV-2 infection, do not rule out co-infections with other pathogens, and should not be used as the sole basis for treatment or other patient management decisions. Negative results must be combined with clinical observations, patient history, and epidemiological information. The expected result is Negative. Fact  Sheet for Patients: HairSlick.nohttps://www.fda.gov/media/138098/download Fact Sheet for Healthcare Providers: quierodirigir.comhttps://www.fda.gov/media/138095/download This test is not yet approved or cleared by the [place] FDA and  has been authorized for detection and/or diagnosis of SARS-CoV-2 by FDA under an Emergency Use Authorization (EUA). This EUA will remain  in effect (  meaning this test can be used) for the duration of the COVID-19 declaration under Section 56 4(b)(1) of the Act, 21 U.S.C. section 360bbb-3(b)(1), unless the authorization is terminated or revoked sooner. Performed at [facility], [place] [address] [place], [place] [address]   MRSA PCR Screening     Status: None   Collection Time: 10/20/19 10:55 AM   Specimen: Nasopharyngeal  Result Value Ref Range Status   MRSA by PCR NEGATIVE NEGATIVE Final    Comment:        The GeneXpert MRSA Assay (FDA approved for NASAL specimens only), is one component of a comprehensive MRSA colonization surveillance program. It is not intended to diagnose MRSA infection nor to guide or monitor treatment for MRSA infections. Performed at [facility], [address], [place], [place] [address]          Radiology Studies: Ct Head Wo Contrast  Result Date: 10/20/2019 CLINICAL DATA:  Altered level of consciousness. EXAM: CT HEAD WITHOUT CONTRAST TECHNIQUE: Contiguous axial images were obtained from the base of the skull through the vertex without intravenous contrast. COMPARISON:  CT head 07/31/2019 FINDINGS: Brain: Moderate atrophy. Moderate chronic microvascular ischemic change in the white matter. Negative for acute infarct or mass. No acute hemorrhage Bilateral subdural fluid collections are improved. Subdural fluid is slightly more dense than CSF consistent with chronic subdural hematoma. Right-sided collection approximately 4 mm. Left-sided collection approximately 3 mm in thickness. Vascular: Negative for hyperdense vessel  Skull: Negative Sinuses/Orbits: Prior sinus surgery. Paranasal sinuses clear. Normal orbit Other: None IMPRESSION: Moderate atrophy and moderate chronic microvascular ischemia. 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 Electronically Signed   By: Franchot Gallo M.D.   On: 10/20/2019 09:05   Ct Abdomen Pelvis W Contrast  Result Date: 10/20/2019 CLINICAL DATA:  Acute generalized abdominal pain. Patient is unresponsive hypoxic and bradycardic. EXAM: CT ABDOMEN AND PELVIS WITH CONTRAST TECHNIQUE: Multidetector CT imaging of the abdomen and pelvis was performed using the standard protocol following bolus administration of intravenous contrast. CONTRAST:  55mL OMNIPAQUE IOHEXOL 300 MG/ML  SOLN COMPARISON:  None. FINDINGS: Lower chest: Left basilar airspace consolidation, likely infiltrate. The right lung base is clear. No pleural or pericardial effusion. Hepatobiliary: No focal hepatic lesions or intrahepatic biliary dilatation. The gallbladder contains a 1 cm calcified gallstone but no findings for acute cholecystitis. No common bile duct dilatation. Pancreas: No mass, inflammation or ductal dilatation. Spleen: Normal size.  No focal lesions. Adrenals/Urinary Tract: The adrenal glands and kidneys are unremarkable. The bladder is grossly normal. There is a small amount of gas in the bladder which could be due to recent catheterization. Stomach/Bowel: The stomach, duodenum, small bowel and colon are grossly normal. No obvious acute inflammatory process, mass lesion or obstructive findings. Colonic interposition is noted. Scattered colonic diverticulosis without findings for acute diverticulitis. Vascular/Lymphatic: Advanced atherosclerotic calcifications involving the aorta and branch vessel ostia and iliac arteries but no aneurysm. The major venous structures are patent. No mesenteric or retroperitoneal mass or adenopathy. No pelvic mass or adenopathy. Reproductive: Surgically  absent. Other: There is a 4.9 x 4.0 cm cystic area in the subcutaneous tissues overlying the right hip. This could be an old liquified hematoma. Musculoskeletal: Severe scoliosis. No significant or acute bony findings. Heterotopic ossification noted near the left hip likely related to previous trauma. IMPRESSION: 1. No acute abdominal/pelvic findings, mass lesions or adenopathy. 2. Left lower lobe airspace consolidation, likely infiltrate. 3. Cholelithiasis without findings for acute cholecystitis. 4.  Advanced vascular disease. 5. Benign-appearing cystic area in the subcutaneous fat overlying the right hip, likely liquified hematoma. Electronically Signed   By: Rudie Meyer M.D.   On: 10/20/2019 09:15   Dg Chest Port 1 View  Result Date: 10/20/2019 CLINICAL DATA:  Unresponsive. Bradycardia. Hypoxia. EXAM: PORTABLE CHEST 1 VIEW COMPARISON:  One-view chest x-ray 07/31/2019 FINDINGS: The heart is enlarged. There is no edema or effusion. There is partial collapse of the left lower lobe. The right lung is clear. Scoliosis is again noted. IMPRESSION: 1. Stable cardiomegaly without failure. 2. Partial collapse of the left lower lobe. While this may represent atelectasis, infection is also considered. Electronically Signed   By: Marin Roberts M.D.   On: 10/20/2019 04:20        Scheduled Meds:  Continuous Infusions:  levETIRAcetam 250 mg (10/21/19 0917)     LOS: 1 day    Time spent: 35 minutes    Alberteen Sam, MD [facility] 10/21/2019, 12:23 PM     Please page though AMION or Epic secure chat:  For [employer], Higher education careers adviser

## 2019-10-21 NOTE — Progress Notes (Signed)
Nutrition Brief Note  Patient identified to be seen for low BMI. Chart reviewed. Patient now transitioning to comfort care.   No nutrition interventions warranted at this time. Please consult RD as needed.   Aleksei Goodlin King, MS, RD, LDN Office: [phone] Pager: [phone] After Hours/Weekend Pager: [phone]   

## 2019-10-21 NOTE — TOC Initial Note (Signed)
Transition of Care [facility]) - Initial/Assessment Note    Patient Details  Name: Brooke Hood MRN: [id number] Date of Birth: Sep 10, 1946  [facility]) CM/SW Contact:    Shelbie Hutching, RN Phone Number: 10/21/2019, 12:21 PM  Clinical Narrative:                 Patient admitted from [facility] with unresponsiveness.  Patient was under Hospice Care with [place].  Since patient is admitted to the [facility] [facility] [place] has discharged the patient from their service.  If patient were to discharge back to [facility], hospice services could be restarted.   Patient is comfort care at this time, RNCM will discuss discharge planning with the patient's son Brooke Hood.  Discharge options would be residential hospice or [place] with hospice care, or home with hospice.    Expected Discharge Plan: Hospice Medical Facility Barriers to Discharge: Other (comment)(family meetings and decision making)   Patient Goals and CMS Choice   CMS Medicare.gov Compare Post Acute Care list provided to:: Patient Represenative (must comment) Choice offered to / list presented to : Adult Children  Expected Discharge Plan and Services Expected Discharge Plan: [place] In-house Referral: Clinical Social Work   Post Acute Care Choice: Hospice Living arrangements for the past 2 months: [name]                                      Prior Living Arrangements/Services Living arrangements for the past 2 months: [name] Lives with:: Facility Resident Patient language and need for interpreter reviewed:: Yes        Need for Family Participation in Patient Care: Yes (Comment)(patient unable to make decisions and participate in care) Care giver support system in place?: Yes (comment)(sons)   Criminal Activity/Legal Involvement Pertinent to Current Situation/Hospitalization: No - Comment as  needed  Activities of Daily Living      Permission Sought/Granted Permission sought to share information with : Case Manager, Customer service manager, Family Supports Permission granted to share information with : Yes, Verbal Permission Granted        Permission granted to share info w Relationship: Son Human resources officer     Emotional Assessment Appearance:: Appears stated age       Alcohol / Substance Use: Not Applicable Psych Involvement: No (comment)  Admission diagnosis:  Dehydration [E86.0] Hypernatremia [E87.0] HCAP (healthcare-associated pneumonia) [J18.9] Altered mental status, unspecified altered mental status type [R41.82] Sepsis, due to unspecified organism, unspecified whether acute organ dysfunction present ([place]) [A41.9] Unresponsiveness [R41.89] Patient Active Problem List   Diagnosis Date Noted  . Pressure injury of skin 10/21/2019  . Unresponsiveness 10/20/2019  . Seizure ([place]) 10/20/2019  . Hypernatremia 10/20/2019  . Acute respiratory failure with hypoxia ([place]) 10/20/2019  . QT prolongation 10/20/2019  . Lactic acidosis 10/20/2019  . DNR (do not resuscitate) 10/20/2019  . Hospice care 10/20/2019  . SDH (subdural hematoma) ([place]) 10/20/2019   PCP:  [facility] Pharmacy:  No Pharmacies Listed    Social Determinants of Health (SDOH) Interventions    Readmission Risk Interventions No flowsheet data found.

## 2019-10-22 LAB — LEVETIRACETAM LEVEL: Levetiracetam Lvl: 9.8 ug/mL — ABNORMAL LOW (ref 10.0–40.0)

## 2019-10-22 MED ORDER — GLYCOPYRROLATE 1 MG PO TABS: 1.0000 mg | ORAL_TABLET | ORAL | Status: AC | PRN

## 2019-10-22 MED ORDER — MORPHINE SULFATE (CONCENTRATE) 10 MG/0.5ML PO SOLN: 5.0000 mg | ORAL | Status: AC | PRN

## 2019-10-22 MED ORDER — POLYVINYL ALCOHOL 1.4 % OP SOLN: 1.0000 [drp] | Freq: Four times a day (QID) | OPHTHALMIC | 0 refills | Status: AC | PRN

## 2019-10-22 MED ORDER — ACETAMINOPHEN 325 MG PO TABS: 650.0000 mg | ORAL_TABLET | Freq: Four times a day (QID) | ORAL | Status: AC | PRN

## 2019-10-22 MED ORDER — LORAZEPAM 2 MG/ML IJ SOLN: 1.0000 mg | INTRAMUSCULAR | 0 refills | Status: AC | PRN

## 2019-10-22 MED ORDER — ONDANSETRON 4 MG PO TBDP: 4.0000 mg | ORAL_TABLET | Freq: Four times a day (QID) | ORAL | 0 refills | Status: AC | PRN

## 2019-10-22 NOTE — Discharge Summary (Signed)
Physician Discharge Summary  Brooke Hood OVZ:[id number] DOB: 06-25-1946 DOA: 10/20/2019  PCP: Orvis Brill, [facility]  Admit date: 10/20/2019 Discharge date: 10/22/2019  Admitted From: [facility]  Disposition:  Residential Hospice    Discharge Condition: To Hospice  CODE STATUS: DO NOT RESUSCITATE Diet recommendation: Comfort feeds  Brief/Interim Summary: Brooke Hood is a 73 y.o. F with dementia ALF dwelling on Hospice, seizures who presented for unresponsiveness.  Evidently staff noted change in clinical status overnight.  They called the hospice nurse who was unable to come out to the facility so they sent the patient to the emergency department.Ptarrived unresponsive.Per EMS, facility called d/t pt having a HR 40's and O2 sats in the 70's.   In the ER, HR 60s-70s. Oxygen saturation 100% on 2.5 L nasal cannula oxygen. Lactic acid 3.8, Na 156, temp 99.72F, CXR with LLL opacity vs collapse.  Cr stable.  Patient started on fluids and admitted.     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   CT head as well as CT abd/pel unremarkable.  However, with more collateral information, it appears that the patient has had a progressive decline for many months, had entered Hospice 2 months ago with primary diagnosis dementia, and had been bed bound for the last few weeks, becoming less and less responsive.   About one week prior to admission, she stopped eating.   As would be expected, after several days, the patient became hypotensives and less  responsive.  Staff were unclear about whom at Hospice to contact, and so sent the patient to the ER.  She was started on IV fluids and antibiotics initially, but had no improvement in status or ability to take food or drink.  Hospice referral was made for inpatient Hospice.     Acute hypoxic respiratory failure due to possible pneumonia  Sepsis ruled out.    Hypernatremia  Seizure disorder  Pressure injury coccyx, stage I POA  QT prolongation   Severe protein calorie malnutrition           Discharge Instructions   Allergies as of 10/22/2019      Reactions   Penicillins Hives      Medication List    STOP taking these medications   Aspercreme Max Strength 4 % Aero Generic drug: Lidocaine   divalproex 125 MG capsule Commonly known as: DEPAKOTE SPRINKLE   docusate sodium 100 MG capsule Commonly known as: COLACE   levETIRAcetam 250 MG tablet Commonly known as: KEPPRA   LORazepam 1 MG tablet Commonly known as: ATIVAN Replaced by: LORazepam 2 MG/ML injection   Melatonin 3 MG Tabs   mirtazapine 15 MG tablet Commonly known as: REMERON   risperiDONE 0.5 MG tablet Commonly known as: RISPERDAL   senna 8.6 MG Tabs tablet Commonly known as: SENOKOT   venlafaxine XR 75 MG 24 hr capsule Commonly known as: EFFEXOR-XR     TAKE these medications   acetaminophen 325 MG tablet Commonly known as: TYLENOL Take 2 tablets (650 mg total) by mouth every 6 (six) hours as needed for mild pain (or Fever >/= 101).   glycopyrrolate 1 MG tablet Commonly known as: ROBINUL Take  1 tablet (1 mg total) by mouth every 4 (four) hours as needed (excessive secretions).   LORazepam 2 MG/ML injection Commonly known as: ATIVAN Inject 0.5 mLs (1 mg total) into the vein every 2 (two) hours as needed for seizure. Replaces: LORazepam 1 MG tablet   morphine CONCENTRATE 10 MG/0.5ML Soln concentrated solution Take 0.25 mLs (5 mg total) by mouth every 2 (two) hours as needed for  moderate pain (or dyspnea).   ondansetron 4 MG disintegrating tablet Commonly known as: ZOFRAN-ODT Take 1 tablet (4 mg total) by mouth every 6 (six) hours as needed for nausea.   polyvinyl alcohol 1.4 % ophthalmic solution Commonly known as: LIQUIFILM TEARS Place 1 drop into both eyes 4 (four) times daily as needed for dry eyes.       Allergies  Allergen Reactions  . Penicillins Hives    Consultations:  Hospice   Procedures/Studies: Ct Head Wo Contrast  Result Date: 10/20/2019 CLINICAL DATA:  Altered level of consciousness. EXAM: CT HEAD WITHOUT CONTRAST TECHNIQUE: Contiguous axial images were obtained from the base of the skull through the vertex without intravenous contrast. COMPARISON:  CT head 07/31/2019 FINDINGS: Brain: Moderate atrophy. Moderate chronic microvascular ischemic change in the white matter. Negative for acute infarct or mass. No acute hemorrhage Bilateral subdural fluid collections are improved. Subdural fluid is slightly more dense than CSF consistent with chronic subdural hematoma. Right-sided collection approximately 4 mm. Left-sided collection approximately 3 mm in thickness. Vascular: Negative for hyperdense vessel Skull: Negative Sinuses/Orbits: Prior sinus surgery. Paranasal sinuses clear. Normal orbit Other: None IMPRESSION: Moderate atrophy and moderate chronic microvascular ischemia. 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 Electronically Signed   By: Marlan Palau M.D.   On: 10/20/2019 09:05   Ct Abdomen Pelvis W Contrast  Result Date: 10/20/2019 CLINICAL DATA:  Acute generalized abdominal pain. Patient is unresponsive hypoxic and bradycardic. EXAM: CT ABDOMEN AND PELVIS WITH CONTRAST TECHNIQUE: Multidetector CT imaging of the abdomen and pelvis was performed using the standard protocol following bolus administration of intravenous contrast. CONTRAST:  75mL OMNIPAQUE IOHEXOL 300 MG/ML  SOLN COMPARISON:  None.  FINDINGS: Lower chest: Left basilar airspace consolidation, likely infiltrate. The right lung base is clear. No pleural or pericardial effusion. Hepatobiliary: No focal hepatic lesions or intrahepatic biliary dilatation. The gallbladder contains a 1 cm calcified gallstone but no findings for acute cholecystitis. No common bile duct dilatation. Pancreas: No mass, inflammation or ductal dilatation. Spleen: Normal size.  No focal lesions. Adrenals/Urinary Tract: The adrenal glands and kidneys are unremarkable. The bladder is grossly normal. There is a small amount of gas in the bladder which could be due to recent catheterization. Stomach/Bowel: The stomach, duodenum, small bowel and colon are grossly normal. No obvious acute inflammatory process, mass lesion or obstructive findings. Colonic interposition is noted. Scattered colonic diverticulosis without findings for acute diverticulitis. Vascular/Lymphatic: Advanced atherosclerotic calcifications involving the aorta and branch vessel ostia and iliac arteries but no aneurysm. The major venous structures are patent. No mesenteric or retroperitoneal mass or adenopathy. No pelvic mass or adenopathy. Reproductive: Surgically absent. Other: There is a 4.9 x 4.0 cm cystic area in the subcutaneous tissues overlying the right hip. This could be an old liquified hematoma. Musculoskeletal: Severe scoliosis. No significant or acute bony findings. Heterotopic ossification noted near the left hip likely related to previous trauma. IMPRESSION: 1. No acute abdominal/pelvic findings, mass lesions or adenopathy. 2. Left lower lobe airspace consolidation, likely infiltrate. 3. Cholelithiasis without findings for acute cholecystitis.  4. Advanced vascular disease. 5. Benign-appearing cystic area in the subcutaneous fat overlying the right hip, likely liquified hematoma. Electronically Signed   By: Rudie MeyerP.  Gallerani M.D.   On: 10/20/2019 09:15   Dg Chest Port 1 View  Result Date:  10/20/2019 CLINICAL DATA:  Unresponsive. Bradycardia. Hypoxia. EXAM: PORTABLE CHEST 1 VIEW COMPARISON:  One-view chest x-ray 07/31/2019 FINDINGS: The heart is enlarged. There is no edema or effusion. There is partial collapse of the left lower lobe. The right lung is clear. Scoliosis is again noted. IMPRESSION: 1. Stable cardiomegaly without failure. 2. Partial collapse of the left lower lobe. While this may represent atelectasis, infection is also considered. Electronically Signed   By: Marin Robertshristopher  Mattern M.D.   On: 10/20/2019 04:20       Subjective: Patient comfortable per nursing and son at bedside.  No agitation, apparent pain, no fever.  Discharge Exam: Vitals:   10/21/19 1001 10/22/19 0832  BP: (!) 120/59 (!) 110/49  Pulse: (!) 55 (!) 56  Resp: 16 12  Temp: (!) 97 F (36.1 C) 97.7 F (36.5 C)  SpO2: 100% 100%   Vitals:   10/21/19 0025 10/21/19 0100 10/21/19 1001 10/22/19 0832  BP: (!) 162/77  (!) 120/59 (!) 110/49  Pulse: 81  (!) 55 (!) 56  Resp: 19  16 12   Temp: 98.7 F (37.1 C)  (!) 97 F (36.1 C) 97.7 F (36.5 C)  TempSrc: Oral  Axillary Oral  SpO2: 99%  100% 100%  Weight:  43.7 kg    Height:  5\' 3"  (1.6 m)      General: Cachectic adult female.  Eyes open, makes eye contact, but no spontaneous verbalizations or movements. Cardiovascular: Tachy, regular.  No murmurs, no edema.   Respiratory: RR fast, shallow. Abdominal: Abdomen soft and non-tender.  No distension or HSM.   Neuro/Psych: Limbs contractured.  No spontaneous verbalizations or movements.    The results of significant diagnostics from this hospitalization (including imaging, microbiology, ancillary and laboratory) are listed below for reference.     Microbiology: Recent Results (from the past 240 hour(s))  Culture, blood (routine x 2)     Status: None (Preliminary result)   Collection Time: 10/20/19  3:47 AM   Specimen: BLOOD  Result Value Ref Range Status   Specimen Description BLOOD RIGHT [facility]   Final   Special Requests   Final    BOTTLES DRAWN AEROBIC AND ANAEROBIC Blood Culture adequate volume   Culture   Final    NO GROWTH 2 DAYS Performed at [facility], [address] [place], [place] [address]    Report Status PENDING  Incomplete  Culture, blood (routine x 2)     Status: None (Preliminary result)   Collection Time: 10/20/19  3:50 AM   Specimen: BLOOD  Result Value Ref Range Status   Specimen Description BLOOD LEFT AC  Final   Special Requests   Final    BOTTLES DRAWN AEROBIC AND ANAEROBIC Blood Culture adequate volume   Culture   Final    NO GROWTH 2 DAYS Performed at [facility], [address] [place], [place] [address]    Report Status PENDING  Incomplete  Urine culture     Status: None   Collection Time: 10/20/19  5:20 AM   Specimen: Urine, Random  Result Value Ref Range Status   Specimen Description   Final    URINE, RANDOM Performed at [facility], [address] [place], [place] [address]  Special Requests   Final    NONE Performed at [facility], [address] [place], [place] [address]    Culture   Final    NO GROWTH Performed at [facility], [address], [place], [place] [address]    Report Status 10/21/2019 FINAL  Final  SARS CORONAVIRUS 2 (TAT 6-24 HRS) Nasopharyngeal Nasopharyngeal Swab     Status: None   Collection Time: 10/20/19  5:48 AM   Specimen: Nasopharyngeal Swab  Result Value Ref Range Status   SARS Coronavirus 2 NEGATIVE NEGATIVE Final    Comment: (NOTE) SARS-CoV-2 target nucleic acids are NOT DETECTED. The SARS-CoV-2 RNA is generally detectable in upper and lower respiratory specimens during the acute phase of infection. Negative results do not preclude SARS-CoV-2 infection, do not rule out co-infections with other pathogens, and should not be used as the sole basis for treatment or other patient management decisions. Negative results must be combined with clinical  observations, patient history, and epidemiological information. The expected result is Negative. Fact Sheet for Patients: HairSlick.no Fact Sheet for Healthcare Providers: quierodirigir.com This test is not yet approved or cleared by the [place] FDA and  has been authorized for detection and/or diagnosis of SARS-CoV-2 by FDA under an Emergency Use Authorization (EUA). This EUA will remain  in effect (meaning this test can be used) for the duration of the COVID-19 declaration under Section 56 4(b)(1) of the Act, 21 U.S.C. section 360bbb-3(b)(1), unless the authorization is terminated or revoked sooner. Performed at [facility], [address], [place], [place] [address]   MRSA PCR Screening     Status: None   Collection Time: 10/20/19 10:55 AM   Specimen: Nasopharyngeal  Result Value Ref Range Status   MRSA by PCR NEGATIVE NEGATIVE Final    Comment:        The GeneXpert MRSA Assay (FDA approved for NASAL specimens only), is one component of a comprehensive MRSA colonization surveillance program. It is not intended to diagnose MRSA infection nor to guide or monitor treatment for MRSA infections. Performed at [facility], [address] [place], [place] [address]      Labs: BNP (last 3 results) No results for input(s): BNP in the last 8760 hours. Basic Metabolic Panel: Recent Labs  Lab 10/20/19 0347 10/20/19 0534 10/20/19 1055 10/21/19 0142 10/21/19 0548  NA 154* 154* 150* 148* 148*  K 3.5 3.7 3.0* 4.3 3.3*  CL 106 109 111 110 111  CO2 33* 34* GLUCOSE 107* 103* 139* 124* 128*  BUN 32* 30* 25* 18 16  CREATININE 0.72 0.70 0.50 0.55 0.43*  CALCIUM 9.2 8.7* 8.1* 8.7* 8.2*   Liver Function Tests: Recent Labs  Lab 10/20/19 0534  AST 16  ALT 10  ALKPHOS 55  BILITOT 0.8  PROT 7.1  ALBUMIN 3.2*   No results for input(s): LIPASE, AMYLASE in the last 168 hours. No results for  input(s): AMMONIA in the last 168 hours. CBC: Recent Labs  Lab 10/20/19 0347 10/21/19 0548  WBC 8.5 8.6  NEUTROABS 6.9  --   HGB 14.1 12.2  HCT 46.2* 37.3  MCV 94.5 89.7  PLT 179 147*   Cardiac Enzymes: No results for input(s): CKTOTAL, CKMB, CKMBINDEX, TROPONINI in the last 168 hours. BNP: Invalid input(s): POCBNP CBG: Recent Labs  Lab 10/21/19 0929  GLUCAP 89   D-Dimer No results for input(s): DDIMER in the last 72 hours. Hgb A1c No results for input(s):  HGBA1C in the last 72 hours. Lipid Profile No results for input(s): CHOL, HDL, LDLCALC, TRIG, CHOLHDL, LDLDIRECT in the last 72 hours. Thyroid function studies No results for input(s): TSH, T4TOTAL, T3FREE, THYROIDAB in the last 72 hours.  Invalid input(s): FREET3 Anemia work up No results for input(s): VITAMINB12, FOLATE, FERRITIN, TIBC, IRON, RETICCTPCT in the last 72 hours. Urinalysis    Component Value Date/Time   COLORURINE AMBER (A) 10/20/2019 0350   APPEARANCEUR HAZY (A) 10/20/2019 0350   LABSPEC 1.031 (H) 10/20/2019 0350   PHURINE 6.0 10/20/2019 0350   GLUCOSEU NEGATIVE 10/20/2019 0350   HGBUR SMALL (A) 10/20/2019 0350   BILIRUBINUR NEGATIVE 10/20/2019 0350   KETONESUR 80 (A) 10/20/2019 0350   PROTEINUR 30 (A) 10/20/2019 0350   NITRITE NEGATIVE 10/20/2019 0350   LEUKOCYTESUR NEGATIVE 10/20/2019 0350   Sepsis Labs Invalid input(s): PROCALCITONIN,  WBC,  LACTICIDVEN Microbiology Recent Results (from the past 240 hour(s))  Culture, blood (routine x 2)     Status: None (Preliminary result)   Collection Time: 10/20/19  3:47 AM   Specimen: BLOOD  Result Value Ref Range Status   Specimen Description BLOOD RIGHT [facility]  Final   Special Requests   Final    BOTTLES DRAWN AEROBIC AND ANAEROBIC Blood Culture adequate volume   Culture   Final    NO GROWTH 2 DAYS Performed at [facility], [address], [place], [place] [address]    Report Status PENDING  Incomplete  Culture, blood (routine x 2)      Status: None (Preliminary result)   Collection Time: 10/20/19  3:50 AM   Specimen: BLOOD  Result Value Ref Range Status   Specimen Description BLOOD LEFT AC  Final   Special Requests   Final    BOTTLES DRAWN AEROBIC AND ANAEROBIC Blood Culture adequate volume   Culture   Final    NO GROWTH 2 DAYS Performed at [facility], [address] [place], [place] [address]    Report Status PENDING  Incomplete  Urine culture     Status: None   Collection Time: 10/20/19  5:20 AM   Specimen: Urine, Random  Result Value Ref Range Status   Specimen Description   Final    URINE, RANDOM Performed at [facility], [address] [place], [place] [address]    Special Requests   Final    NONE Performed at [facility], [address] [place], [place] [address]    Culture   Final    NO GROWTH Performed at [facility], [address], [place], [place] [address]    Report Status 10/21/2019 FINAL  Final  SARS CORONAVIRUS 2 (TAT 6-24 HRS) Nasopharyngeal Nasopharyngeal Swab     Status: None   Collection Time: 10/20/19  5:48 AM   Specimen: Nasopharyngeal Swab  Result Value Ref Range Status   SARS Coronavirus 2 NEGATIVE NEGATIVE Final    Comment: (NOTE) SARS-CoV-2 target nucleic acids are NOT DETECTED. The SARS-CoV-2 RNA is generally detectable in upper and lower respiratory specimens during the acute phase of infection. Negative results do not preclude SARS-CoV-2 infection, do not rule out co-infections with other pathogens, and should not be used as the sole basis for treatment or other patient management decisions. Negative results must be combined with clinical observations, patient history, and epidemiological information. The expected result is Negative. Fact Sheet for Patients: HairSlick.no Fact Sheet for Healthcare Providers: quierodirigir.com This test is not yet approved or cleared by the  [place] [employer] trader and  has been authorized for detection and/or diagnosis of SARS-CoV-2 by FDA under an TEFL teacher (EUA). This EUA will remain  in effect (meaning this test can be used) for the duration of the COVID-19 declaration under Section 56 4(b)(1) of the Act, 21 U.S.C. section 360bbb-3(b)(1), unless the authorization is terminated or revoked sooner. Performed at [facility], [address], [place], [place] [address]   MRSA PCR Screening     Status: None   Collection Time: 10/20/19 10:55 AM   Specimen: Nasopharyngeal  Result Value Ref Range Status   MRSA by PCR NEGATIVE NEGATIVE Final    Comment:        The GeneXpert MRSA Assay (FDA approved for NASAL specimens only), is one component of a comprehensive MRSA colonization surveillance program. It is not intended to diagnose MRSA infection nor to guide or monitor treatment for MRSA infections. Performed at [facility], [address], [place], [place] [address]      Time coordinating discharge: 25 minutes      SIGNED:   Alberteen Sam, MD  [facility] 10/22/2019, 2:24 PM

## 2019-10-22 NOTE — Progress Notes (Signed)
EMS notified for pick up at 8:30 pm. Staff RN Isaiah Serge made aware. Please let PIV in place. Flo Shanks BSN, RN, [place] [phone]

## 2019-10-22 NOTE — TOC Transition Note (Signed)
Transition of Care [facility]) - CM/SW Discharge Note   Patient Details  Name: Omaya Nieland MRN: [id number] Date of Birth: [birth year]  Transition of Care [facility]) CM/SW Contact:  Shelbie Hutching, RN Phone Number: 10/22/2019, 11:15 AM   Clinical Narrative:     Patient will discharge to residential hospice, [facility] home in [place].  Patient will discharge later tonight.  Patient's son Harrington Challenger is at the bedside.  Patient will transport via [employer] cabin crew.    Final next level of care: [place] Barriers to Discharge: Barriers Resolved   Patient Goals and CMS Choice   CMS Medicare.gov Compare Post Acute Care list provided to:: Patient Represenative (must comment) Choice offered to / list presented to : Adult Children  Discharge Placement              Patient chooses bed at: [facility] in [place]) Patient to be transferred to facility by: [facility] Name of family member notified: Harrington Challenger and Thurmond Butts- sons Patient and family notified of of transfer: 10/22/19  Discharge Plan and Services In-house Referral: Clinical Social Work   Post Acute Care Choice: Hospice                               Social Determinants of Health (SDOH) Interventions     Readmission Risk Interventions No flowsheet data found.

## 2019-10-22 NOTE — Care Management Important Message (Signed)
Important Message  Patient Details  Name: Brooke Hood MRN: [id number] Date of Birth: 1946/06/13   Medicare Important Message Given:  Yes     Juliann Pulse A Ritu Gagliardo 10/22/2019, 11:20 AM

## 2019-10-22 NOTE — Progress Notes (Signed)
Follow up visit made to new referral for [facility] hosp[facility] home. Patient seen lying in bed, alert at times, son Harrington Challenger at bedside. Writer provided education regarding hospice services, philosophy, team approach to care and current visitation policy with understanding voiced. Plan is for transfer to the hospice home this evening. Hospital care team updated, family aware. Discharge summary to be faxed to referral when ready. Will continue to follow through discharge. Flo Shanks BSN, RN, [place] [phone]

## 2019-10-22 NOTE — Progress Notes (Signed)
Pt discharged to [place] via EMS. Discharge packet sent with pt.

## 2019-10-25 LAB — CULTURE, BLOOD (ROUTINE X 2)
Culture: NO GROWTH
Culture: NO GROWTH
Special Requests: ADEQUATE
Special Requests: ADEQUATE

## 2019-11-19 DEATH — deceased
